# Patient Record
Sex: Male | Born: 1950 | Race: White | Hispanic: No | Marital: Married | Smoking: Former smoker
Health system: Southern US, Community
[De-identification: ages and names within clinical notes are randomized; demographics above are authoritative.]

## PROBLEM LIST (undated history)

## (undated) DIAGNOSIS — E7800 Pure hypercholesterolemia, unspecified: Secondary | ICD-10-CM

## (undated) DIAGNOSIS — K922 Gastrointestinal hemorrhage, unspecified: Secondary | ICD-10-CM

## (undated) DIAGNOSIS — K219 Gastro-esophageal reflux disease without esophagitis: Secondary | ICD-10-CM

## (undated) DIAGNOSIS — C84A0 Cutaneous T-cell lymphoma, unspecified, unspecified site: Secondary | ICD-10-CM

## (undated) DIAGNOSIS — G4733 Obstructive sleep apnea (adult) (pediatric): Secondary | ICD-10-CM

## (undated) DIAGNOSIS — N400 Enlarged prostate without lower urinary tract symptoms: Secondary | ICD-10-CM

## (undated) DIAGNOSIS — Z9989 Dependence on other enabling machines and devices: Secondary | ICD-10-CM

## (undated) DIAGNOSIS — K635 Polyp of colon: Secondary | ICD-10-CM

## (undated) DIAGNOSIS — E0500 Thyrotoxicosis with diffuse goiter without thyrotoxic crisis or storm: Secondary | ICD-10-CM

## (undated) DIAGNOSIS — IMO0002 Reserved for concepts with insufficient information to code with codable children: Secondary | ICD-10-CM

## (undated) HISTORY — PX: HERNIA REPAIR: SHX51

## (undated) HISTORY — DX: Gastrointestinal hemorrhage, unspecified: K92.2

## (undated) HISTORY — DX: Cutaneous T-cell lymphoma, unspecified, unspecified site: C84.A0

## (undated) HISTORY — PX: APPENDECTOMY: SHX54

## (undated) HISTORY — PX: NASAL SEPTUM SURGERY: SHX37

## (undated) HISTORY — PX: COLONOSCOPY W/ POLYPECTOMY: SHX1380

## (undated) HISTORY — DX: Obstructive sleep apnea (adult) (pediatric): G47.33

## (undated) HISTORY — DX: Thyrotoxicosis with diffuse goiter without thyrotoxic crisis or storm: E05.00

## (undated) HISTORY — DX: Polyp of colon: K63.5

## (undated) HISTORY — PX: OTHER SURGICAL HISTORY: SHX169

## (undated) HISTORY — DX: Pure hypercholesterolemia, unspecified: E78.00

## (undated) HISTORY — DX: Benign prostatic hyperplasia without lower urinary tract symptoms: N40.0

## (undated) HISTORY — PX: ELBOW SURGERY: SHX618

## (undated) HISTORY — DX: Reserved for concepts with insufficient information to code with codable children: IMO0002

## (undated) HISTORY — DX: Gastro-esophageal reflux disease without esophagitis: K21.9

## (undated) HISTORY — DX: Dependence on other enabling machines and devices: Z99.89

## (undated) HISTORY — PX: COLON RESECTION: SHX5231

---

## 2012-07-07 ENCOUNTER — Other Ambulatory Visit: Payer: Self-pay | Admitting: Family Medicine

## 2012-07-07 ENCOUNTER — Ambulatory Visit
Admission: RE | Admit: 2012-07-07 | Discharge: 2012-07-07 | Disposition: A | Discharge status: Home / Self Care | Payer: BC Managed Care – PPO | Source: Ambulatory Visit | Attending: Family Medicine | Admitting: Family Medicine

## 2012-07-07 DIAGNOSIS — M545 Low back pain: Secondary | ICD-10-CM

## 2012-07-08 ENCOUNTER — Other Ambulatory Visit: Payer: Self-pay | Admitting: Family Medicine

## 2012-07-14 ENCOUNTER — Ambulatory Visit
Admission: RE | Admit: 2012-07-14 | Discharge: 2012-07-14 | Disposition: A | Discharge status: Home / Self Care | Payer: BC Managed Care – PPO | Source: Ambulatory Visit | Attending: Family Medicine | Admitting: Family Medicine

## 2015-08-07 ENCOUNTER — Other Ambulatory Visit (HOSPITAL_COMMUNITY): Payer: Self-pay | Admitting: Endocrinology

## 2015-08-07 DIAGNOSIS — E0590 Thyrotoxicosis, unspecified without thyrotoxic crisis or storm: Secondary | ICD-10-CM

## 2015-08-22 ENCOUNTER — Ambulatory Visit (HOSPITAL_COMMUNITY): Payer: Self-pay

## 2015-08-22 ENCOUNTER — Encounter (HOSPITAL_COMMUNITY)
Admission: RE | Admit: 2015-08-22 | Discharge: 2015-08-22 | Disposition: A | Discharge status: Home / Self Care | Payer: BLUE CROSS/BLUE SHIELD | Source: Ambulatory Visit | Attending: Endocrinology | Admitting: Endocrinology

## 2015-08-22 ENCOUNTER — Other Ambulatory Visit (HOSPITAL_COMMUNITY): Payer: Self-pay

## 2015-08-22 DIAGNOSIS — E041 Nontoxic single thyroid nodule: Secondary | ICD-10-CM | POA: Insufficient documentation

## 2015-08-22 DIAGNOSIS — E0590 Thyrotoxicosis, unspecified without thyrotoxic crisis or storm: Secondary | ICD-10-CM

## 2015-08-22 MED ORDER — SODIUM IODIDE I 131 CAPSULE: 7.2000 | Freq: Once | INTRAVENOUS | Status: DC | PRN

## 2015-08-23 ENCOUNTER — Other Ambulatory Visit (HOSPITAL_COMMUNITY): Payer: Self-pay

## 2015-08-23 ENCOUNTER — Encounter (HOSPITAL_COMMUNITY)
Admission: RE | Admit: 2015-08-23 | Discharge: 2015-08-23 | Disposition: A | Discharge status: Home / Self Care | Payer: BLUE CROSS/BLUE SHIELD | Source: Ambulatory Visit | Attending: Endocrinology | Admitting: Endocrinology

## 2015-08-23 DIAGNOSIS — E0590 Thyrotoxicosis, unspecified without thyrotoxic crisis or storm: Secondary | ICD-10-CM | POA: Diagnosis not present

## 2015-08-23 DIAGNOSIS — E041 Nontoxic single thyroid nodule: Secondary | ICD-10-CM | POA: Diagnosis not present

## 2015-08-23 MED ORDER — SODIUM PERTECHNETATE TC 99M INJECTION
10.2000 | Freq: Once | INTRAVENOUS | Status: AC | PRN
  Administered 2015-08-23: 10 via INTRAVENOUS

## 2015-08-30 ENCOUNTER — Other Ambulatory Visit (HOSPITAL_COMMUNITY): Payer: Self-pay | Admitting: Endocrinology

## 2015-08-30 DIAGNOSIS — E041 Nontoxic single thyroid nodule: Secondary | ICD-10-CM

## 2015-09-03 ENCOUNTER — Ambulatory Visit (HOSPITAL_COMMUNITY)
Admission: RE | Admit: 2015-09-03 | Discharge: 2015-09-03 | Disposition: A | Discharge status: Home / Self Care | Payer: BLUE CROSS/BLUE SHIELD | Source: Ambulatory Visit | Attending: Endocrinology | Admitting: Endocrinology

## 2015-09-03 DIAGNOSIS — E041 Nontoxic single thyroid nodule: Secondary | ICD-10-CM

## 2015-09-11 ENCOUNTER — Other Ambulatory Visit: Payer: Self-pay | Admitting: Endocrinology

## 2015-09-11 DIAGNOSIS — E042 Nontoxic multinodular goiter: Secondary | ICD-10-CM

## 2015-09-19 ENCOUNTER — Other Ambulatory Visit (HOSPITAL_COMMUNITY)
Admission: RE | Admit: 2015-09-19 | Discharge: 2015-09-19 | Disposition: A | Discharge status: Home / Self Care | Payer: BLUE CROSS/BLUE SHIELD | Source: Ambulatory Visit | Attending: Physician Assistant | Admitting: Physician Assistant

## 2015-09-19 ENCOUNTER — Ambulatory Visit
Admission: RE | Admit: 2015-09-19 | Discharge: 2015-09-19 | Disposition: A | Discharge status: Home / Self Care | Payer: BLUE CROSS/BLUE SHIELD | Source: Ambulatory Visit | Attending: Endocrinology | Admitting: Endocrinology

## 2015-09-19 DIAGNOSIS — E042 Nontoxic multinodular goiter: Secondary | ICD-10-CM

## 2015-09-19 NOTE — Procedures (Signed)
Using direct ultrasound guidance, 4 passes were made using 25 gauge needles into the nodule within the right lobe of the thyroid.   Ultrasound was used to confirm needle placements on all occasions.   Specimens were sent to Pathology for analysis.   Armelia Penton S Milik Gilreath PA-C 09/19/2015 9:44 AM

## 2015-10-04 ENCOUNTER — Other Ambulatory Visit (HOSPITAL_COMMUNITY): Payer: Self-pay | Admitting: Endocrinology

## 2015-10-04 DIAGNOSIS — E0500 Thyrotoxicosis with diffuse goiter without thyrotoxic crisis or storm: Secondary | ICD-10-CM

## 2015-10-12 ENCOUNTER — Encounter (HOSPITAL_COMMUNITY)
Admission: RE | Admit: 2015-10-12 | Discharge: 2015-10-12 | Disposition: A | Discharge status: Home / Self Care | Payer: BLUE CROSS/BLUE SHIELD | Source: Ambulatory Visit | Attending: Endocrinology | Admitting: Endocrinology

## 2015-10-12 DIAGNOSIS — E041 Nontoxic single thyroid nodule: Secondary | ICD-10-CM | POA: Insufficient documentation

## 2015-10-12 DIAGNOSIS — E0590 Thyrotoxicosis, unspecified without thyrotoxic crisis or storm: Secondary | ICD-10-CM | POA: Diagnosis not present

## 2015-10-12 DIAGNOSIS — E0500 Thyrotoxicosis with diffuse goiter without thyrotoxic crisis or storm: Secondary | ICD-10-CM

## 2015-10-12 MED ORDER — SODIUM IODIDE I 131 CAPSULE
20.0000 | Freq: Once | INTRAVENOUS | Status: AC | PRN
  Administered 2015-10-12: 20 via ORAL

## 2015-12-27 ENCOUNTER — Ambulatory Visit (INDEPENDENT_AMBULATORY_CARE_PROVIDER_SITE_OTHER): Payer: BLUE CROSS/BLUE SHIELD | Admitting: Neurology

## 2015-12-27 ENCOUNTER — Encounter: Payer: Self-pay | Admitting: Neurology

## 2015-12-27 VITALS — BP 150/68 | HR 72 | Resp 16 | Ht 68.0 in | Wt 179.0 lb

## 2015-12-27 DIAGNOSIS — G2581 Restless legs syndrome: Secondary | ICD-10-CM | POA: Diagnosis not present

## 2015-12-27 DIAGNOSIS — G4710 Hypersomnia, unspecified: Secondary | ICD-10-CM | POA: Diagnosis not present

## 2015-12-27 DIAGNOSIS — G4761 Periodic limb movement disorder: Secondary | ICD-10-CM | POA: Diagnosis not present

## 2015-12-27 DIAGNOSIS — Z9989 Dependence on other enabling machines and devices: Principal | ICD-10-CM

## 2015-12-27 DIAGNOSIS — R51 Headache: Secondary | ICD-10-CM | POA: Diagnosis not present

## 2015-12-27 DIAGNOSIS — G4733 Obstructive sleep apnea (adult) (pediatric): Secondary | ICD-10-CM

## 2015-12-27 DIAGNOSIS — R519 Headache, unspecified: Secondary | ICD-10-CM

## 2015-12-27 NOTE — Patient Instructions (Addendum)
Based on your symptoms and your exam I believe you are still at risk for obstructive sleep apnea or OSA, and I think we should proceed with a sleep study to determine whether you do or do not have OSA and how severe it is. If you have more than mild OSA, you will benefit from treatment, such as the oral appliance vs. treatment with CPAP. Please remember, the risks and ramifications of moderate to severe obstructive sleep apnea or OSA are: Cardiovascular disease, including congestive heart failure, stroke, difficult to control hypertension, arrhythmias, and even type 2 diabetes has been linked to untreated OSA. Sleep apnea causes disruption of sleep and sleep deprivation in most cases, which, in turn, can cause recurrent headaches, problems with memory, mood, concentration, focus, and vigilance. Most people with untreated sleep apnea report excessive daytime sleepiness, which can affect their ability to drive. Please do not drive if you feel sleepy.   I will likely see you back after your sleep study to go over the test results and where to go from there. We will call you after your sleep study to advise about the results (most likely, you will hear from Beverlee Nims, my nurse) and to set up an appointment at the time, as necessary.    Our sleep lab administrative assistant, Arrie Aran will meet with you or call you to schedule your sleep study. If you don't hear back from her by next week please feel free to call her at [phone]. This is her direct line and please leave a message with your phone number to call back if you get the voicemail box. She will call back as soon as possible.

## 2015-12-27 NOTE — Progress Notes (Signed)
Subjective:    Patient ID: Ryan Gonzales is a 64 y.o. male.  HPI     Ryan Age, MD, PhD [facility] [address], [address] [address] [place], [place] [address]  Dear Ryan Gonzales Scrape,  I saw your patient, Ryan Gonzales, upon your kind request in my neurologic clinic today for initial consultation of his sleep disorder, in particular, concern for underlying obstructive sleep apnea. The patient is unaccompanied today. As you know, Ryan Gonzales is a 65 year old right-handed gentleman with an underlying medical history of hypertension, hyperlipidemia, thyroid d/s, and arthritis, who reports residual daytime somnolence, and a prior diagnosis of OSA. Epworth Sleepiness Scale score is 7 out of 24 today, fatigue score is 44 out of 63.  He was diagnosed with obstructive sleep apnea over 10 years ago and placed on CPAP therapy. Prior test results were reviewed today: He had a sleep study at [facility] clinic in [place]: 02/11/2005. Total AHI was 14 per hour, REM AHI 49 per hour, oxyhemoglobin desaturation nadir was 85% during supine REM sleep, sleep efficiency 85%, arousal index was 40 per hour. He had an increased percentage of stage II sleep at 81.7%, REM sleep was 7.2%, REM latency prolonged at 173.5 minutes, slow-wave sleep at 8%. His weight at the time was 172 pounds. He then had a CPAP titration study at the same clinic on 03/05/2005 and I reviewed the results: Sleep efficiency was 79%, CPAP was started at 5 cm and increased 12 cm. O2 nadir was 92% on 11 cm. He was placed on a CPAP treatment pressure of 12 cm.  He has been on 2 different oral appliances in the interim, first one in [place], second one in [place], which worked well for 2 years, but by 2010, he had to quit using it d/t teeth pain.  He has since the been using a FFM and CPAP, turned pressure down to 10 cm and then up to 11 cm. He had nasal surgery in 2007.  He reports that he is still using CPAP but we were not able to get  a recent compliance download, last available download was from 2012. He would like to have his sleep apnea reevaluated. He would like to be considered for dental appliance rather than CPAP therapy, especially as he did well for several years on an oral appliance. Of note, he has to use a fullface mask because of mouth opening, nasal congestion improved after his nasal surgery, he denies any allergies, has rare morning headaches, noted morning headaches when he stopped using CPAP briefly thinking he could get away without it but put himself back on it. He does not use a humidifier because of inconvenience with the humidifier but does admit to having quite a bit of dryness because of not having used humidity with his CPAP. He has occasional and mild restless leg symptoms, typically few and far between. 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 He quit smoking in 1976, drinks caffeine in the form of coffee, 3 cups per day, alcohol in the form of beer a couple times per week, typically no liquor. He lives with his wife. He has 1 dog. He lost his only child secondary to a rare glycogen storage disease.  His Past Medical History Is Significant For: Past Medical History:  Diagnosis Date  . BPH (benign prostatic hyperplasia)   . Colon polyps   . CTCL (cutaneous T-cell lymphoma) (HCC)   .  Epicondylitis   . GERD (gastroesophageal reflux disease)   . GI bleeding   . Graves disease   . Hypercholesterolemia   . OSA on CPAP     His Past Surgical History Is Significant For: Past Surgical History:  Procedure Laterality Date  . APPENDECTOMY    . COLON RESECTION    . COLONOSCOPY W/ POLYPECTOMY    . colonscopy    . ELBOW SURGERY Bilateral    tendonitis  . HERNIA REPAIR    . NASAL SEPTUM SURGERY      His Family History Is Significant For: Family History  Problem Relation Gonzales of Onset  . Cancer Mother     skin  . Hypercholesterolemia Mother   .  Cancer Father     bladder  . Hypercholesterolemia Father     His Social History Is Significant For: Social History   Social History  . Marital status: Married    Spouse name: Ryan Gonzales  . Number of children: 1  . Years of education: college   Social History Main Topics  . Smoking status: Former Smoker    Years: 3.00    Quit date: 08/27/1973  . Smokeless tobacco: Never Used  . Alcohol use 1.2 - 2.4 oz/week    1 - 2 Glasses of wine, 1 - 2 Cans of beer per week     Comment: 1-2 weekly  . Drug use: No  . Sexual activity: Not Asked   Other Topics Concern  . None   Social History Narrative   Drinks 3 cups of coffee a day     His Allergies Are:  No Known Allergies:   His Current Medications Are:  Outpatient Encounter Prescriptions as of 12/27/2015  Medication Sig  . Bromelains (BROMELAIN PO) Take 1,000 mg by mouth daily.  . cholecalciferol (VITAMIN D) 1000 UNITS tablet Take 1,000 Units by mouth daily.  Marland Kitchen doxazosin (CARDURA) 1 MG tablet Take 1 mg by mouth daily.  Marland Kitchen levothyroxine (SYNTHROID, LEVOTHROID) 125 MCG tablet Take 125 mcg by mouth daily before breakfast.  . liothyronine (CYTOMEL) 25 MCG tablet   . Multiple Vitamin (MULTIVITAMIN) tablet Take 1 tablet by mouth daily.  Marland Kitchen omeprazole (PRILOSEC) 20 MG capsule Take 20 mg by mouth daily.  . simvastatin (ZOCOR) 20 MG tablet Take 20 mg by mouth daily.  . tamsulosin (FLOMAX) 0.4 MG CAPS capsule   . [DISCONTINUED] CHONDROITIN SULFATE PO Take 800 mg by mouth daily.  . [DISCONTINUED] Glucosamine 500 MG CAPS Take by mouth 2 (two) times daily.  . [DISCONTINUED] Saw Palmetto, Serenoa repens, (SAW PALMETTO PO) Take 1,350 mg by mouth daily.   No facility-administered encounter medications on file as of 12/27/2015.   :  Review of Systems:  Out of a complete 14 point review of systems, all are reviewed and negative with the exception of these symptoms as listed below: Review of Systems  Neurological:       Patient had sleep study about  11 years ago. He was placed on CPAP and reports that he is still using it. He would like to repeat sleep study in hope to get an oral appliance. Last reading we were able to down load was from 2012. Last CPAP machine came from [facility].   Snoring, witnessed apnea  Epworth Sleepiness Scale 0= would never doze 1= slight chance of dozing 2= moderate chance of dozing 3= high chance of dozing  Sitting and reading:1 Watching TV:1 Sitting inactive in a public place (ex. Theater or meeting):0 As a passenger  in a car for an hour without a break:2 Lying down to rest in the afternoon:2 Sitting and talking to someone:0 Sitting quietly after lunch (no alcohol):1 In a car, while stopped in traffic:0 Total:7   Objective:  Neurologic Exam  Physical Exam Physical Examination:   Vitals:   12/27/15 0858  BP: (!) 150/68  Pulse: 72  Resp: 16    General Examination: The patient is a very pleasant 65 y.o. male in no acute distress. He appears well-developed and well-nourished and well groomed.   HEENT: Normocephalic, atraumatic, pupils are equal, round and reactive to light and accommodation. Funduscopic exam is normal with sharp disc margins noted. Extraocular tracking is good without limitation to gaze excursion or nystagmus noted. Normal smooth pursuit is noted. Hearing is grossly intact. Tympanic membranes are clear bilaterally. Face is symmetric with normal facial animation and normal facial sensation. Speech is clear with no dysarthria noted. There is no hypophonia. There is no lip, neck/head, jaw or voice tremor. Neck is supple with full range of passive and active motion. There are no carotid bruits on auscultation. Oropharynx exam reveals: mild mouth dryness, good dental hygiene and mild airway crowding, due to redundant soft palate and tonsils in place, about 1-2+ bilaterally. Mallampati is class II. Tongue protrudes centrally and palate elevates symmetrically. Neck size is 16.5 inches. He has a  Mild overbite. Nasal inspection reveals no significant nasal mucosal bogginess or redness and no septal deviation.   Chest: Clear to auscultation without wheezing, rhonchi or crackles noted.  Heart: S1+S2+0, regular and normal without murmurs, rubs or gallops noted.   Abdomen: Soft, non-tender and non-distended with normal bowel sounds appreciated on auscultation.  Extremities: There is no pitting edema in the distal lower extremities bilaterally. Pedal pulses are intact.  Skin: Warm and dry without trophic changes noted. There are no varicose veins on the right, mild varicose veins in the left calf, stable according to patient.  Musculoskeletal: exam reveals no obvious joint deformities, tenderness or joint swelling or erythema.   Neurologically:  Mental status: The patient is awake, alert and oriented in all 4 spheres. His immediate and remote memory, attention, language skills and fund of knowledge are appropriate. There is no evidence of aphasia, agnosia, apraxia or anomia. Speech is clear with normal prosody and enunciation. Thought process is linear. Mood is normal and affect is normal.  Cranial nerves II - XII are as described above under HEENT exam. In addition: shoulder shrug is normal with equal shoulder height noted. Motor exam: Normal bulk, strength and tone is noted. There is no drift, tremor or rebound. Romberg is negative. Reflexes are 3+ throughout. Fine motor skills and coordination: intact with normal finger taps, normal hand movements, normal rapid alternating patting, normal foot taps and normal foot agility.  Cerebellar testing: No dysmetria or intention tremor on finger to nose testing. Heel to shin is unremarkable bilaterally. There is no truncal or gait ataxia.  Sensory exam: intact to light touch, pinprick, vibration, temperature sense in the upper and lower extremities.  Gait, station and balance: He stands easily. No veering to one side is noted. No leaning to one side  is noted. Posture is Gonzales-appropriate and stance is narrow based. Gait shows normal stride length and normal pace. No problems turning are noted. Tandem walk is unremarkable.   Assessment and Plan:  In summary, Ryan Gonzales is a very pleasant 65 y.o.-year old male with an underlying medical history of hypertension, hyperlipidemia, thyroid d/s, and arthritis, who  was previously diagnosed with obstructive sleep apnea about 11 years ago and placed on CPAP therapy, also tried an oral appliance intermittently for a few years, now back on CPAP therapy for the past 6-7 years. He reports  residual daytime tiredness, has mild intermittent restless leg symptoms, rare morning headaches. He would benefit from reevaluation with a sleep study, would like to pursue a dental appliance a possible.  I had a long chat with the patient about my findings and the diagnosis of OSA, its prognosis and treatment options. We talked about medical treatments, surgical interventions and non-pharmacological approaches. I explained in particular the risks and ramifications of untreated moderate to severe OSA, especially with respect to developing cardiovascular disease down the Road, including congestive heart failure, difficult to treat hypertension, cardiac arrhythmias, or stroke. Even type 2 diabetes has, in part, been linked to untreated OSA. Symptoms of untreated OSA include daytime sleepiness, memory problems, mood irritability and mood disorder such as depression and anxiety, lack of energy, as well as recurrent headaches, especially morning headaches. We talked about trying to maintain a healthy lifestyle in general, as well as the importance of weight control. I encouraged the patient to eat healthy, exercise daily and keep well hydrated, to keep a scheduled bedtime and wake time routine, to not skip any meals and eat healthy snacks in between meals. I advised the patient not to drive when feeling sleepy. I recommended the  following at this time: Diagnostic sleep study.   I explained the sleep test procedure to the patient and also outlined possible surgical and non-surgical treatment options of OSA, including the use of a custom-made dental device (which would require a referral to a specialist dentist or oral surgeon), upper airway surgical options, such as pillar implants, radiofrequency surgery, tongue base surgery, and UPPP (which would involve a referral to an ENT surgeon). Rarely, jaw surgery such as mandibular advancement may be considered.  I also explained the CPAP treatment option to the patient, who indicated that he would be willing to use CPAP if the dental appliance does not work out.  I answered all his questions today and the patient was in agreement. I would like to see him back after the sleep study is completed and encouraged him to call with any interim questions, concerns, problems or updates.   Thank you very much for allowing me to participate in the care of this nice patient. If I can be of any further assistance to you please do not hesitate to call me at [phone].  Sincerely,   Ryan Age, MD, PhD

## 2016-01-27 ENCOUNTER — Ambulatory Visit (INDEPENDENT_AMBULATORY_CARE_PROVIDER_SITE_OTHER): Payer: BLUE CROSS/BLUE SHIELD | Admitting: Neurology

## 2016-01-27 DIAGNOSIS — G4761 Periodic limb movement disorder: Secondary | ICD-10-CM

## 2016-01-27 DIAGNOSIS — G4733 Obstructive sleep apnea (adult) (pediatric): Secondary | ICD-10-CM

## 2016-01-27 DIAGNOSIS — G4720 Circadian rhythm sleep disorder, unspecified type: Secondary | ICD-10-CM

## 2016-02-01 ENCOUNTER — Telehealth: Payer: Self-pay | Admitting: Neurology

## 2016-02-01 NOTE — Progress Notes (Signed)
PATIENT'S NAME:  Ryan Gonzales, Ryan Gonzales DOB:      01/28/2014      MR#:    [id number]     DATE OF RECORDING: 01/27/2016 REFERRING M.D.:  Barth Kirks, DMD Study Performed:   Baseline Polysomnogram HISTORY:  65 year old right-handed gentleman with an underlying medical history of hypertension, hyperlipidemia, thyroid d/s, and arthritis, who reports residual daytime somnolence, and a prior diagnosis of OSA.  The patient endorsed the Epworth Sleepiness Scale at 7/24 points and the Fatigue Score at --- points.    The patient's weight 179 pounds with a height of 68 (inches), resulting in a BMI of 27.1 kg/m2.  The patient's neck circumference measured 16 inches.  CURRENT MEDICATIONS: Bromelains, Cholecalciferol, Doxazosin, Levothyroxine, Liothyronin, Multi-Vitamin, Omeprazole, Simvastatin and Tamsulosin   PROCEDURE:  This is a multichannel digital polysomnogram utilizing the Somnostar 11.2 system.  Electrodes and sensors were applied and monitored per AASM Specifications.   EEG, EOG, Chin and Limb EMG, were sampled at 200 Hz.  ECG, Snore and Nasal Pressure, Thermal Airflow, Respiratory Effort, CPAP Flow and Pressure, Oximetry was sampled at 50 Hz. Digital video and audio were recorded.      BASELINE STUDY  Lights Out was at 23:10 and Lights On at 05:13.  Total recording time (TRT) was 363.5, with a total sleep time (TST) of 318.5 minutes.   The patient's sleep latency was 29 minutes.  REM latency was 80.5 minutes, which is normal.  The sleep efficiency was 87.6 %.     SLEEP ARCHITECTURE: Sleep Period Wake was 25 minutes with mild sleep fragmentation noted, Stage N1 3.9%, Stage N2 66.4%, which is increased, Stage N3 1.6% and Stage R (REM sleep) 28.1%.   RESPIRATORY ANALYSIS:  There was a total of 24 respiratory events:  13 obstructive apneas, 0 central apneas and 0 mixed apneas with a total of 13 apneas and an apnea index (AI) of 2.4. There were 11 hypopneas with a hypopnea index of 2.1. The patient also had 0  respiratory event related arousals (RERAs).      The total APNEA/HYPOPNEA INDEX (AHI) was 4.5 and the total RESPIRATORY DISTURBANCE INDEX was 4.5.  5 events occurred in REM sleep and 14 events in NREM. The REM AHI was 3.4, versus a non-REM AHI of 5.. The patient spent 41% of total sleep time in the supine position. The supine AHI was 8.3 versus a non-supine AHI of 1.9.  OXYGEN SATURATION & C02:  The baseline 02 saturation was 98%, with the lowest being 90%. Time spent below 89% saturation equaled 0 minutes.   PERIODIC LIMB MOVEMENTS:   The patient had a total of 152 Periodic Limb Movements.  The Periodic Limb Movement (PLM) index was 28.6 and the PLM Arousal index was 3.6.   IMPRESSION:  1. Periodic Limb Movement Disorder  2. Dysfunctions associated with sleep stages or arousal from sleep  RECOMMENDATIONS:  1. This study did not demonstrate any significant sleep disordered breathing. Mildly elevated supine AHI of 8.3/hour may improve with weight loss and avoidance of the supine sleep position. 2.  Moderate PLMs were noted without significant arousals; clinical correlation is recommended.    3. A follow up appointment will be scheduled in the Sleep Clinic at [facility]. The referring provider will be notified of the results.    I certify that I have reviewed the entire raw data recording prior to the issuance of this report in accordance with the Standards of Accreditation of the [place] [employer] (AASM)  Star Age, MD, PhD Diplomat, American Board of Psychiatry and Neurology  Diplomat, [place] [employer]

## 2016-02-01 NOTE — Telephone Encounter (Signed)
Patient referred by Dr. Bennett Scrape, seen by me on 12/27/15, diagnostic PSG on 01/27/16.   Please call and notify the patient that the recent sleep study did not show any significant obstructive sleep apnea. Please inform patient that I would like to go over the details of the study during a follow up appointment. Arrange a followup appointment. Also, route or fax report to PCP and referring MD, if other than PCP.  Once you have spoken to patient, you can close this encounter.   Thanks,  Star Age, MD, PhD [facility])

## 2016-02-05 ENCOUNTER — Telehealth: Payer: Self-pay

## 2016-02-05 NOTE — Telephone Encounter (Signed)
I spoke to patient and he is aware of results and recommendations. He is was able to make an appt for Monday.

## 2016-02-05 NOTE — Telephone Encounter (Signed)
Sent sleep study to referring doctor

## 2016-02-11 ENCOUNTER — Ambulatory Visit (INDEPENDENT_AMBULATORY_CARE_PROVIDER_SITE_OTHER): Payer: BLUE CROSS/BLUE SHIELD | Admitting: Neurology

## 2016-02-11 ENCOUNTER — Encounter: Payer: Self-pay | Admitting: Neurology

## 2016-02-11 VITALS — BP 120/62 | HR 78 | Resp 16 | Ht 68.0 in | Wt 180.0 lb

## 2016-02-11 DIAGNOSIS — G4761 Periodic limb movement disorder: Secondary | ICD-10-CM

## 2016-02-11 DIAGNOSIS — G2581 Restless legs syndrome: Secondary | ICD-10-CM | POA: Diagnosis not present

## 2016-02-11 DIAGNOSIS — G4733 Obstructive sleep apnea (adult) (pediatric): Secondary | ICD-10-CM | POA: Diagnosis not present

## 2016-02-11 NOTE — Progress Notes (Signed)
Subjective:    Patient ID: Ryan Gonzales is a 65 y.o. male.  HPI     Interim history:   Ryan Gonzales is a 65 year old right-handed gentleman with an underlying medical history of hypertension, hyperlipidemia, thyroid d/s, and arthritis, who presents for follow-up consultation of his sleep disorder, after his recent sleep study. The patient is accompanied by his wife today. I first met him on 12/27/2015 at the request of his dentist, at which time the patient reported snoring and excessive daytime somnolence as well as a prior diagnosis of OSA. I invited him for sleep study. He had a baseline sleep study on 01/27/2016. I went over his test results with him in detail today. Sleep efficiency was 87.6%, REM latency 80.5 minutes and sleep latency was 29 minutes. Wake after sleep onset was 25 minutes with mild sleep fragmentation noted. He had an increased percentage of stage II sleep, and a mildly increased percentage of REM sleep at 28.1%. Total AHI was 4.5 per hour, REM AHI was 3.4 per hour, supine AHI was 8.3 per hour. Average oxygen saturation was 98%, nadir was 90%. He had moderate PLMS with an index of 28.6 per hour, with an associated mild arousal index of 3.6 per hour only.  Today, 02/11/2016: He has intermittent restless leg symptoms. He does move his legs at night while asleep but not every night. His wife gives some tonic water sometimes at night. He reports having been treated for hyperthyroidism in July with radioiodine, was diagnosed in 3/17 and now on synthroid, which was recently increased from 125 mcg to 127 mcg about a month ago. He sees Dr. Wilson Singer for this. He has trouble breathing through his nose. He is considering seeing an allergy specialist and ENT. He has LBP, intermittently, uses Mobic and heat pad. He had seen a neurosurgeon years ago, had an MRI L spine on 07/15/15: IMPRESSION: Posterior disc protrusion on the right at L4-5 with a small adjacent extruded disc fragment compressing  the right L5 nerve root.  Mild spinal stenosis is present and mild retrolisthesis is present at L4-5.  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  There is impingement of the right L5 nerve root.  Previously:   12/27/2015: He reports residual daytime somnolence, and a prior diagnosis of OSA. Epworth Sleepiness Scale score is 7 out of 24 today, fatigue score is 44 out of 63.  He was diagnosed with obstructive sleep apnea over 10 years ago and placed on CPAP therapy. Prior test results were reviewed today: He had a sleep study at [facility] clinic in [place]: 02/11/2005. Total AHI was 14 per hour, REM AHI 49 per hour, oxyhemoglobin desaturation nadir was 85% during supine REM sleep, sleep efficiency 85%, arousal index was 40 per hour. He had an increased percentage of stage II sleep at 81.7%, REM sleep was 7.2%, REM latency prolonged at 173.5 minutes, slow-wave sleep at 8%. His weight at the time was 172 pounds. He then had a CPAP titration study at the same clinic on 03/05/2005 and I reviewed the results: Sleep efficiency was 79%, CPAP was started at 5 cm and increased 12 cm. O2 nadir was 92% on 11 cm. He was placed on a CPAP treatment pressure of 12 cm.  He has been on 2 different oral appliances in the interim, first one in [place], second one in [place], which worked well for 2 years, but by 2010, he had to quit using it d/t teeth pain.  He has since the been using a FFM and CPAP, turned pressure down to 10 cm and then up to 11 cm. He had nasal surgery in 2007.  He reports that he is still using CPAP but we were not able to get a recent compliance download, last available download was from 2012. He would like to have his sleep apnea reevaluated. He would like to be considered for dental appliance rather than CPAP therapy, especially as he did well for several years on an oral appliance. Of note, he has to use a fullface mask because of mouth opening, nasal congestion improved after his  nasal surgery, he denies any allergies, has rare morning headaches, noted morning headaches when he stopped using CPAP briefly thinking he could get away without it but put himself back on it. He does not use a humidifier because of inconvenience with the humidifier but does admit to having quite a bit of dryness because of not having used humidity with his CPAP. He has occasional and mild restless leg symptoms, typically few and far between. 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 He quit smoking in 1976, drinks caffeine in the form of coffee, 3 cups per day, alcohol in the form of beer a couple times per week, typically no liquor. He lives with his wife. He has 1 dog. He lost his only child secondary to a rare glycogen storage disease.   His Past Medical History Is Significant For: Past Medical History:  Diagnosis Date  . BPH (benign prostatic hyperplasia)   . Colon polyps   . CTCL (cutaneous T-cell lymphoma) (HCC)   . Epicondylitis   . GERD (gastroesophageal reflux disease)   . GI bleeding   . Graves disease   . Hypercholesterolemia   . OSA on CPAP     His Past Surgical History Is Significant For: Past Surgical History:  Procedure Laterality Date  . APPENDECTOMY    . COLON RESECTION    . COLONOSCOPY W/ POLYPECTOMY    . colonscopy    . ELBOW SURGERY Bilateral    tendonitis  . HERNIA REPAIR    . NASAL SEPTUM SURGERY      His Family History Is Significant For: Family History  Problem Relation Age of Onset  . Cancer Mother     skin  . Hypercholesterolemia Mother   . Cancer Father     bladder  . Hypercholesterolemia Father     His Social History Is Significant For: Social History   Social History  . Marital status: Married    Spouse name: Nevin Bloodgood  . Number of children: 1  . Years of education: college   Social History Main Topics  . Smoking status: Former Smoker    Years: 3.00    Quit date: 08/27/1973  .  Smokeless tobacco: Never Used  . Alcohol use 1.2 - 2.4 oz/week    1 - 2 Glasses of wine, 1 - 2 Cans of beer per week     Comment: 1-2 weekly  . Drug use: No  . Sexual activity: Not Asked   Other Topics Concern  . None   Social History Narrative   Drinks 3 cups of coffee a day     His Allergies Are:  No Known Allergies:   His Current Medications Are:  Outpatient Encounter Prescriptions as of 02/11/2016  Medication Sig  . Bromelains (BROMELAIN PO) Take 500 mg by mouth 2 (two) times  daily.   . cholecalciferol (VITAMIN D) 1000 UNITS tablet Take 1,000 Units by mouth daily.  Marland Kitchen doxazosin (CARDURA) 1 MG tablet Take 1 mg by mouth daily.  . Multiple Vitamin (MULTIVITAMIN) tablet Take 1 tablet by mouth daily.  Marland Kitchen omeprazole (PRILOSEC) 20 MG capsule Take 20 mg by mouth daily.  . simvastatin (ZOCOR) 20 MG tablet Take 20 mg by mouth at bedtime.   Marland Kitchen SYNTHROID 137 MCG tablet   . tamsulosin (FLOMAX) 0.4 MG CAPS capsule Take 0.4 mg by mouth at bedtime.   Marland Kitchen tiZANidine (ZANAFLEX) 2 MG tablet   . [DISCONTINUED] levothyroxine (SYNTHROID, LEVOTHROID) 125 MCG tablet Take 125 mcg by mouth daily before breakfast.  . [DISCONTINUED] liothyronine (CYTOMEL) 25 MCG tablet    No facility-administered encounter medications on file as of 02/11/2016.   :  Review of Systems:  Out of a complete 14 point review of systems, all are reviewed and negative with the exception of these symptoms as listed below: Review of Systems  HENT:       Patient has appt with ENT to discuss difficulty breathing through nose.   Musculoskeletal: Positive for back pain.  Neurological:       Patient is here to discuss sleep study.       Objective:  Neurologic Exam  Physical Exam Physical Examination:   Vitals:   02/11/16 0932  BP: 120/62  Pulse: 78  Resp: 16   General Examination: The patient is a very pleasant 65 y.o. male in no acute distress. He appears well-developed and well-nourished and well groomed. Good  spirits.  HEENT: Normocephalic, atraumatic, pupils are equal, round and reactive to light and accommodation. Funduscopic exam is normal with sharp disc margins noted. Extraocular tracking is good without limitation to gaze excursion or nystagmus noted. Normal smooth pursuit is noted. Hearing is grossly intact. Face is symmetric with normal facial animation and normal facial sensation. Speech is clear with no dysarthria noted. There is no hypophonia. There is no lip, neck/head, jaw or voice tremor. Neck is supple with full range of passive and active motion. There are no carotid bruits on auscultation. Oropharynx exam reveals: mild mouth dryness, good dental hygiene and mild airway crowding, due to redundant soft palate and tonsils in place, about 1-2+ bilaterally. Mallampati is class II. Tongue protrudes centrally and palate elevates symmetrically. Nasal inspection reveals no significant nasal mucosal bogginess or redness and no septal deviation.   Chest: Clear to auscultation without wheezing, rhonchi or crackles noted.  Heart: S1+S2+0, regular and normal without murmurs, rubs or gallops noted.   Abdomen: Soft, non-tender and non-distended with normal bowel sounds appreciated on auscultation.  Extremities: There is no pitting edema in the distal lower extremities bilaterally. Pedal pulses are intact.  Skin: Warm and dry without trophic changes noted. There are no varicose veins on the right, mild varicose veins in the left calf, stable according to patient. has a small dime size sharp circumscribed red spot on his left shin, very well demarcated, he states that this is what cutaneous T-cell lymphoma can look like. Almost looks like a fungal infection to me. He does have an appointment with his dermatologist.  Musculoskeletal: exam reveals no obvious joint deformities, tenderness or joint swelling or erythema.   Neurologically:  Mental status: The patient is awake, alert and oriented in all 4  spheres. His immediate and remote memory, attention, language skills and fund of knowledge are appropriate. There is no evidence of aphasia, agnosia, apraxia or anomia. Speech is clear  with normal prosody and enunciation. Thought process is linear. Mood is normal and affect is normal.  Cranial nerves II - XII are as described above under HEENT exam. In addition: shoulder shrug is normal with equal shoulder height noted. Motor exam: Normal bulk, strength and tone is noted. There is no drift, tremor or rebound. Romberg is negative. Reflexes are 3+ throughout. Fine motor skills and coordination: intact with normal finger taps, normal hand movements, normal rapid alternating patting, normal foot taps and normal foot agility.  Cerebellar testing: No dysmetria or intention tremor on finger to nose testing. Heel to shin is unremarkable bilaterally. There is no truncal or gait ataxia.  Sensory exam: intact to light touch in the upper and lower extremities.  Gait, station and balance: He stands easily. No veering to one side is noted. No leaning to one side is noted. Posture is age-appropriate and stance is narrow based. Gait shows normal stride length and normal pace. No problems turning are noted. Tandem walk is unremarkable.   Assessment and Plan:  In summary, MANAN OLMO is a very pleasant 65 year old male with an underlying medical history of hypertension, hyperlipidemia, thyroid d/s, and arthritis, who Presents for follow-up consultation after his recent sleep study. His baseline sleep study from 01/27/2016 showed an overall borderline AHI at 4.5 per hour, supine AHI was 8.3 per hour, rendering mild supine obstructive sleep apnea, average oxygen saturation remained above 90% all night, nadir was 90%, average for the night was 98%. He was was previously diagnosed with obstructive sleep apnea about 11 years ago and placed on CPAP therapy, and then tried an oral appliance intermittently for a few years, then  went back on CPAP therapy for the past 6-7 years. At this juncture, his sleep disordered breathing is not bad enough to warrant CPAP therapy. He does recall, working harder back then, was with [employer] for 30 years and had more fatigue when he was first diagnosed with obstructive sleep apnea. He has intermittent restless leg symptoms. I suggested we proceed with lab work including iron studies and B12 level. We will call him with his test results. We talked about potentially utilizing medication for restless legs and PLMS. We will wait it out some as he still is adjusting to his thyroid medication and may need further adjustment of the dose of his Synthroid.  We talked about the sleep test results in detail today.  physical exam is stable. I suggested a four-month checkup, at which time we will talk about restless leg symptoms and PLMS again and consider medication for this. In the interim, we will call him with his blood test results. If he has a ferritin level less than 50, he may benefit from iron supplementation. He does take a multivitamin.   I answered all their questions today and the patient and his wife were in agreement. I spent 25 minutes in total face-to-face time with the patient, more than 50% of which was spent in counseling and coordination of care, reviewing test results, reviewing medication and discussing or reviewing the diagnosis of PLMD, RLS, OSA, its prognosis and treatment options.

## 2016-02-11 NOTE — Patient Instructions (Addendum)
Your recent sleep study did not show any significant obstructive sleep apnea. You have overall borderline findings, mild sleep apnea was evident when you sleep on your back. For this, CPAP therapy is not necessary. I would recommend that you try to sleep on your sides and try to lose some weight.   We will check blood work today and call you with the test results.  We will talk about restless leg symptoms and treatment for leg movements next time again. We may consider medication for this in the future.

## 2016-02-12 ENCOUNTER — Telehealth: Payer: Self-pay | Admitting: *Deleted

## 2016-02-12 LAB — CBC WITH DIFFERENTIAL/PLATELET
BASOS: 0 %
Basophils Absolute: 0 10*3/uL (ref 0.0–0.2)
EOS (ABSOLUTE): 0.2 10*3/uL (ref 0.0–0.4)
EOS: 4 %
HEMATOCRIT: 40.8 % (ref 37.5–51.0)
HEMOGLOBIN: 13.4 g/dL (ref 12.6–17.7)
Immature Grans (Abs): 0 10*3/uL (ref 0.0–0.1)
Immature Granulocytes: 1 %
LYMPHS ABS: 1.2 10*3/uL (ref 0.7–3.1)
Lymphs: 25 %
MCH: 29.6 pg (ref 26.6–33.0)
MCHC: 32.8 g/dL (ref 31.5–35.7)
MCV: 90 fL (ref 79–97)
MONOCYTES: 11 %
MONOS ABS: 0.5 10*3/uL (ref 0.1–0.9)
Neutrophils Absolute: 3 10*3/uL (ref 1.4–7.0)
Neutrophils: 59 %
Platelets: 249 10*3/uL (ref 150–379)
RBC: 4.53 x10E6/uL (ref 4.14–5.80)
RDW: 15.3 % (ref 12.3–15.4)
WBC: 5 10*3/uL (ref 3.4–10.8)

## 2016-02-12 LAB — B12 AND FOLATE PANEL: Vitamin B-12: 774 pg/mL (ref 211–946)

## 2016-02-12 LAB — IRON AND TIBC
Iron Saturation: 29 % (ref 15–55)
Iron: 99 ug/dL (ref 38–169)
TIBC: 343 ug/dL (ref 250–450)
UIBC: 244 ug/dL (ref 111–343)

## 2016-02-12 LAB — FERRITIN: FERRITIN: 84 ng/mL (ref 30–400)

## 2016-02-12 NOTE — Telephone Encounter (Signed)
Called and spoke to pt about lab results per Dr Rexene Alberts note. Pt verbalized understanding.

## 2016-02-12 NOTE — Telephone Encounter (Signed)
-----   Message from Star Age, MD sent at 02/12/2016  3:07 PM EDT ----- Please call patient, labs looked good, we will pick up our discussion about restless leg syndrome and leg twitching at the next visit. Star Age, MD, PhD [facility])

## 2016-02-12 NOTE — Progress Notes (Signed)
Please call patient, labs looked good, we will pick up our discussion about restless leg syndrome and leg twitching at the next visit. Star Age, MD, PhD [facility])

## 2016-02-26 ENCOUNTER — Ambulatory Visit
Admission: RE | Admit: 2016-02-26 | Discharge: 2016-02-26 | Disposition: A | Discharge status: Home / Self Care | Payer: BLUE CROSS/BLUE SHIELD | Source: Ambulatory Visit | Attending: Family Medicine | Admitting: Family Medicine

## 2016-02-26 ENCOUNTER — Other Ambulatory Visit: Payer: Self-pay | Admitting: Family Medicine

## 2016-02-26 DIAGNOSIS — M533 Sacrococcygeal disorders, not elsewhere classified: Secondary | ICD-10-CM

## 2016-03-10 ENCOUNTER — Other Ambulatory Visit: Payer: Self-pay | Admitting: Family Medicine

## 2016-03-10 DIAGNOSIS — M533 Sacrococcygeal disorders, not elsewhere classified: Secondary | ICD-10-CM

## 2016-03-10 DIAGNOSIS — M545 Low back pain: Secondary | ICD-10-CM

## 2016-03-13 ENCOUNTER — Encounter: Payer: Self-pay | Admitting: Allergy & Immunology

## 2016-03-13 ENCOUNTER — Ambulatory Visit (INDEPENDENT_AMBULATORY_CARE_PROVIDER_SITE_OTHER): Payer: BLUE CROSS/BLUE SHIELD | Admitting: Allergy & Immunology

## 2016-03-13 ENCOUNTER — Encounter (INDEPENDENT_AMBULATORY_CARE_PROVIDER_SITE_OTHER): Payer: Self-pay

## 2016-03-13 VITALS — BP 108/72 | HR 80 | Temp 97.7°F | Ht 69.0 in | Wt 182.4 lb

## 2016-03-13 DIAGNOSIS — G4733 Obstructive sleep apnea (adult) (pediatric): Secondary | ICD-10-CM

## 2016-03-13 DIAGNOSIS — K219 Gastro-esophageal reflux disease without esophagitis: Secondary | ICD-10-CM | POA: Diagnosis not present

## 2016-03-13 DIAGNOSIS — R0981 Nasal congestion: Secondary | ICD-10-CM

## 2016-03-13 DIAGNOSIS — J3089 Other allergic rhinitis: Secondary | ICD-10-CM | POA: Insufficient documentation

## 2016-03-13 MED ORDER — MONTELUKAST SODIUM 10 MG PO TABS: 10.0000 mg | ORAL_TABLET | Freq: Every day | ORAL | 5 refills | Status: DC

## 2016-03-13 NOTE — Progress Notes (Signed)
NEW PATIENT  Date of Service/Encounter:  03/13/16   Assessment:   Chronic nonseasonal allergic rhinitis due to pollen - Plan: Allergy Test, Interdermal Allergy Test  OSA (obstructive sleep apnea)  Chronic nasal congestion  Gastroesophageal reflux disease, esophagitis presence not specified    Plan/Recommendations:   1. Chronic rhinitis with nasal congestion - Testing today was positive to [place] grass, Johnson grass, ragweed, weed, trees, molds, cat, dog, and dust mite - Avoidance measures discussed below. - I would add on Singulair 33m nightly, which can help with nasal enlargement and nasal symptoms. - I would add on a nasal steroid to help with inflammation (Flonase two sprays per nostril daily), which can help shrink the turbinates and might help with avoiding surgery.  - Flonase needs to be used daily for the best relief (you can also use a different nose spray if you have that at home) - Use the Afrin first to open up the nose and then spray the two squirts of Flonase into the nasal cavity. - If the medications do not work, we can consider starting allergy shots as a means of "re-training" your immune system to ignore the allergens. - Check with your insurance company to check on the copays associated with allergy shots.  - General information on the mechanisms and treatment expectations of allergy shots discussed. - Otherwise, you might need surgery as a definitive treatment.   2. OSA (obstructive sleep apnea) - Continue with management per Dr. WErik Obey - Could consider adding on the humidifier feature to see if this can help with your nasal symptom  3. Gastroesophageal reflux disease - These symptoms are likely making your symptoms worse. - Continue with omeprazole daily.  4. Return in about 1 month (around 04/12/2016).   Subjective:   KLAMAJ METOYERis a 65y.o. male presenting today for evaluation of  Chief Complaint  Patient presents with  . New  Evaluation    Interested in allergy test. Having issues breathing through his nose.   .Marland Kitchen KGRANVEL PROUDFOOThas a history of the following: Patient Active Problem List   Diagnosis Date Noted  . Chronic nonseasonal allergic rhinitis due to pollen 03/13/2016  . OSA (obstructive sleep apnea) 03/13/2016  . Gastroesophageal reflux disease 03/13/2016    History obtained from: chart review and patient and his wife.  KJefm Pettywas referred by HShirline Frees MD.     KRayfieldis a 65y.o. male presenting for allergy testing. Mr. BFiliphas an extensive history of chronic rhinitis and nasal congestion. This is been going on for years. [place] He has [place] been allergy tested. He does not note any specific trigger and his symptoms are consistent throughout the entire year. He reports that his "head is always clogged up". He also has an extensive history of snoring. He reports that he had a sleep study performed around 10 or 15 years ago and has been on a CPAP machine for more than 10 years. He recently had another sleep study which was normal. Mr. BWiederholtdoes have a remote history of a deviated septum which was surgically corrected more than a decade ago.  He [facility], but was noted that he had enlarged turbinates bilaterally. Review of his nose showed that he felt that reflux and not using the humidifier with the CPAP were contributing to to his throat symptoms. He also recommended using 1 spray  of Afrin at night every other day to see if that opened him up sufficiently to avoid snoring. He also recommended that he be seen for allergy testing.  Mr. Witter has never tried using a nasal steroid for his symptoms. He does occasionally use antihistamines which will provide minimal relief. Overall, he does not like to take medications. Patient denies postnasal drip, but the wife does endorse throat clearing constantly. He does  have a dog at his home, which has never bothered him in the past.  Mr. Cain does have a history of reflux and takes omeprazole every morning. He has been on this for approximately 15 years. He has had endoscopies in the past, with the last one around 2008. He has a remote history of 2 GI bleeds. He also had a colectomy around the age of 31 secondary to an obstructing polyp. At that time, he had symptoms that consisted of rectal pain and bleeding. He was getting colonoscopies every year given his history of the large polyp at such an early age, but these have now been spaced out to every 5 years. None of his biopsies have been malignant.  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 This was treated initially with an alkylating mustard agent, which resulted in an allergic reaction. He was then switched to PUVA therapy, which she remained on for several months with complete resolution of the lymphoma. This presented initially as always thought to be ringworm but was not responsive to antifungal medications.  Mr. Coonradt also has a history of hyperthyroidism which was diagnosed earlier this calendar year. He has undergone radiation therapy for thyroid ablation and is on thyroid supplementation now. He is followed by Dr. Wilson Singer for this.  Otherwise, there is no history of other atopic diseases, including asthma, drug allergies, food allergies, stinging insect allergies, or urticaria. There is no significant infectious history. Vaccinations are up to date.    Past Medical History: Patient Active Problem List   Diagnosis Date Noted  . Chronic nonseasonal allergic rhinitis due to pollen 03/13/2016  . OSA (obstructive sleep apnea) 03/13/2016  . Gastroesophageal reflux disease 03/13/2016    Medication List:    Medication List       Accurate as of 03/13/16 12:04 PM. Always use your most recent med list.          APPLE CIDER VINEGAR PO Take 450 mg by mouth.   aspirin EC 81 MG  tablet Take 81 mg by mouth daily.   BROMELAIN PO Take 500 mg by mouth 2 (two) times daily.   cholecalciferol 1000 units tablet Commonly known as:  VITAMIN D Take 1,000 Units by mouth daily.   docusate sodium 100 MG capsule Commonly known as:  COLACE Take 100 mg by mouth.   doxazosin 1 MG tablet Commonly known as:  CARDURA Take 1 mg by mouth daily.   levothyroxine 137 MCG tablet Commonly known as:  SYNTHROID, LEVOTHROID   meloxicam 15 MG tablet Commonly known as:  MOBIC Take 15 mg by mouth.   methylcellulose oral powder Take 1 packet by mouth daily.   montelukast 10 MG tablet Commonly known as:  SINGULAIR Take 1 tablet (10 mg total) by mouth at bedtime.   multivitamin tablet Take 1 tablet by mouth daily.   omeprazole 20 MG capsule Commonly known as:  PRILOSEC Take by mouth.   simvastatin 20 MG tablet Commonly known as:  ZOCOR Take 20 mg by mouth at bedtime.   tamsulosin 0.4 MG  Caps capsule Commonly known as:  FLOMAX Take by mouth.   tiZANidine 2 MG tablet Commonly known as:  ZANAFLEX   Turmeric 500 MG Tabs Take 500 mg by mouth.   vitamin C 500 MG tablet Commonly known as:  ASCORBIC ACID Take 500 mg by mouth daily.       Birth History: non-contributory.   Developmental History: Ledger has met all milestones on time. He has required no speech therapy, occupational therapy, or physical therapy.   Past Surgical History: Past Surgical History:  Procedure Laterality Date  . APPENDECTOMY    . COLON RESECTION    . COLONOSCOPY W/ POLYPECTOMY    . colonscopy    . ELBOW SURGERY Bilateral    tendonitis  . HERNIA REPAIR    . NASAL SEPTUM SURGERY       Family History: Family History  Problem Relation Age of Onset  . Cancer Mother     skin  . Hypercholesterolemia Mother   . Cancer Father     bladder  . Hypercholesterolemia Father   . Allergic rhinitis Neg Hx   . Angioedema Neg Hx   . Asthma Neg Hx   . Atopy Neg Hx   . Eczema Neg Hx   .  Immunodeficiency Neg Hx   . Urticaria Neg Hx      Social History: Akash lives at home with his wife. He lives in an [place] year old townhome. There is carpeting throughout the home. He has gas heating and central cooling. There is one dog inside the home. There are no roach or rodent issues. There is no smoking exposure currently, although he did smoke 1 pack per day for 5 years from [place] through 1976. He does have dust mite covers on his bed and pillows. 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 He does pursue woodworking as a hobby. He and his wife had a son who died at the age of 31 secondary to complications of a glycogen storage disease (13 years ago).    Review of Systems: a 14-point review of systems is pertinent for what is mentioned in HPI.  Otherwise, all other systems were negative. Constitutional: negative other than that listed in the HPI Eyes: negative other than that listed in the HPI Ears, nose, mouth, throat, and face: negative other than that listed in the HPI Respiratory: negative other than that listed in the HPI Cardiovascular: negative other than that listed in the HPI Gastrointestinal: negative other than that listed in the HPI Genitourinary: negative other than that listed in the HPI Integument: negative other than that listed in the HPI Hematologic: negative other than that listed in the HPI Musculoskeletal: negative other than that listed in the HPI Neurological: negative other than that listed in the HPI Allergy/Immunologic: negative other than that listed in the HPI    Objective:   Blood pressure 108/72, pulse 80, temperature 97.7 F (36.5 C), temperature source  Oral, height 5' 9"  (1.753 m), weight 182 lb 6.4 oz (82.7 kg), SpO2 94 %. Body mass index is 26.94 kg/m.   Physical Exam:  General: Alert, interactive, in no acute distress. Cooperative with the exam. Very friendly. HEENT: TMs pearly gray, turbinates markedly  edematous and pale with clear discharge, post-pharynx erythematous. No polyps. Difficult to see past the inferior turbinates (R>>L). Neck: Supple without thyromegaly. Adenopathy: no enlarged lymph nodes appreciated in the anterior cervical, occipital, axillary, epitrochlear, inguinal, or popliteal regions Lungs: Clear to auscultation without wheezing, rhonchi or rales. No increased work of breathing. CV: Physiologic splitting of S1/S2, no murmurs. Capillary refill <2 seconds.  Abdomen: Nondistended, nontender. No guarding or rebound tenderness. Bowel sounds faint and present in all fields  Skin: Warm and dry, without lesions or rashes. Extremities:  No clubbing, cyanosis or edema. Neuro:   Grossly intact. No deficits noted.   Diagnostic studies:   Allergy Studies:   Indoor/Outdoor Percutaneous Adult Environmental Panel: negative to all 59 indoor and outdoor allergens  Indoor/Outdoor Selected Intradermal Environmental Panel: positive to [place] grass, Johnson grass, ragweed, weed, trees, molds, cat, dog, and dust mite     Salvatore Marvel, MD FAAAAI [facility]

## 2016-03-13 NOTE — Patient Instructions (Addendum)
1. Chronic rhinitis with nasal congestion - Testing today was positive to [place] grass, Johnson grass, ragweed, weed, trees, molds, cat, dog, and dust mite - Avoidance measures discussed below. - I would add on Singulair 10mg  nightly, which can help with nasal enlargement and nasal symptoms. - I would add on a nasal steroid to help with inflammation (Flonase two sprays per nostril daily), which can help shrink the turbinates and might help with avoiding surgery.  - Flonase needs to be used daily for the best relief (you can also use a different nose spray if you have that at home) - Use the Afrin first to open up the nose and then spray the two squirts of Flonase into the nasal cavity. - If the medications do not work, we can consider starting allergy shots as a means of "re-training" your immune system to ignore the allergens. - Check with your insurance company to check on the copays associated with allergy shots.  - Otherwise, you might need surgery as a definitive treatment.   2. OSA (obstructive sleep apnea) - Continue with management per Dr. Erik Obey. - Could consider adding on the humidifier feature to see if this can help with your nasal symptom  3. Gastroesophageal reflux disease - These symptoms are likely making your symptoms worse. - Continue with omeprazole daily.  4. Return in about 1 month (around 04/12/2016).  Please inform us of any Emergency Department visits, hospitalizations, or changes in symptoms. Call us before going to the ED for breathing or allergy symptoms since we might be able to fit you in for a sick visit. Feel free to contact us anytime with any questions, problems, or concerns.  It was a pleasure to meet you and your family today!   Websites that have reliable patient information: 1. American Academy of Asthma, Allergy, and Immunology: www.aaaai.org 2. Food Allergy Research and Education (FARE): foodallergy.org 3. Mothers of Asthmatics:  http://www.asthmacommunitynetwork.org 4. American College of Allergy, Asthma, and Immunology: www.acaai.org  Reducing Pollen Exposure  The American Academy of Allergy, Asthma and Immunology suggests the following steps to reduce your exposure to pollen during allergy seasons.    1. Do not hang sheets or clothing out to dry; pollen may collect on these items. 2. Do not mow lawns or spend time around freshly cut grass; mowing stirs up pollen. 3. Keep windows closed at night.  Keep car windows closed while driving. 4. Minimize morning activities outdoors, a time when pollen counts are usually at their highest. 5. Stay indoors as much as possible when pollen counts or humidity is high and on windy days when pollen tends to remain in the air longer. 6. Use air conditioning when possible.  Many air conditioners have filters that trap the pollen spores. 7. Use a HEPA room air filter to remove pollen form the indoor air you breathe.  Control of House Dust Mite Allergen    House dust mites play a major role in allergic asthma and rhinitis.  They occur in environments with high humidity wherever human skin, the food for dust mites is found. High levels have been detected in dust obtained from mattresses, pillows, carpets, upholstered furniture, bed covers, clothes and soft toys.  The principal allergen of the house dust mite is found in its feces.  A gram of dust may contain 1,000 mites and 250,000 fecal particles.  Mite antigen is easily measured in the air during house cleaning activities.    1. Encase mattresses, including the box spring, and pillow,  in an air tight cover.  Seal the zipper end of the encased mattresses with wide adhesive tape. 2. Wash the bedding in water of 130 degrees Farenheit weekly.  Avoid cotton comforters/quilts and flannel bedding: the most ideal bed covering is the dacron comforter. 3. Remove all upholstered furniture from the bedroom. 4. Remove carpets, carpet padding,  rugs, and non-washable window drapes from the bedroom.  Wash drapes weekly or use plastic window coverings. 5. Remove all non-washable stuffed toys from the bedroom.  Wash stuffed toys weekly. 6. Have the room cleaned frequently with a vacuum cleaner and a damp dust-mop.  The patient should not be in a room which is being cleaned and should wait 1 hour after cleaning before going into the room. 7. Close and seal all heating outlets in the bedroom.  Otherwise, the room will become filled with dust-laden air.  An electric heater can be used to heat the room. 8. Reduce indoor humidity to less than 50%.  Do not use a humidifier.  Control of Dog or Cat Allergen  Avoidance is the best way to manage a dog or cat allergy. If you have a dog or cat and are allergic to dog or cats, consider removing the dog or cat from the home. If you have a dog or cat but don't want to find it a new home, or if your family wants a pet even though someone in the household is allergic, here are some strategies that may help keep symptoms at bay:  1. Keep the pet out of your bedroom and restrict it to only a few rooms. Be advised that keeping the dog or cat in only one room will not limit the allergens to that room. 2. Don't pet, hug or kiss the dog or cat; if you do, wash your hands with soap and water. 3. High-efficiency particulate air (HEPA) cleaners run continuously in a bedroom or living room can reduce allergen levels over time. 4. Regular use of a high-efficiency vacuum cleaner or a central vacuum can reduce allergen levels. 5. Giving your dog or cat a bath at least once a week can reduce airborne allergen.  Control of Mold Allergen  Mold and fungi can grow on a variety of surfaces provided certain temperature and moisture conditions exist.  Outdoor molds grow on plants, decaying vegetation and soil.  The major outdoor mold, Alternaria and Cladosporium, are found in very high numbers during hot and dry conditions.   Generally, a late Summer - Fall peak is seen for common outdoor fungal spores.  Rain will temporarily lower outdoor mold spore count, but counts rise rapidly when the rainy period ends.  The most important indoor molds are Aspergillus and Penicillium.  Dark, humid and poorly ventilated basements are ideal sites for mold growth.  The next most common sites of mold growth are the bathroom and the kitchen.  Outdoor [employer] 1. Use air conditioning and keep windows closed 2. Avoid exposure to decaying vegetation. 3. Avoid leaf raking. 4. Avoid grain handling. 5. Consider wearing a face mask if working in moldy areas.  Indoor Mold Control 1. Maintain humidity below 50%. 2. Clean washable surfaces with 5% bleach solution. 3. Remove sources e.g. contaminated carpets.

## 2016-03-15 ENCOUNTER — Ambulatory Visit
Admission: RE | Admit: 2016-03-15 | Discharge: 2016-03-15 | Disposition: A | Discharge status: Home / Self Care | Payer: BLUE CROSS/BLUE SHIELD | Source: Ambulatory Visit | Attending: Family Medicine | Admitting: Family Medicine

## 2016-03-15 DIAGNOSIS — M545 Low back pain: Secondary | ICD-10-CM

## 2016-03-15 DIAGNOSIS — M533 Sacrococcygeal disorders, not elsewhere classified: Secondary | ICD-10-CM

## 2016-04-08 ENCOUNTER — Encounter (HOSPITAL_BASED_OUTPATIENT_CLINIC_OR_DEPARTMENT_OTHER): Payer: Self-pay | Admitting: *Deleted

## 2016-04-10 ENCOUNTER — Telehealth: Payer: Self-pay | Admitting: Allergy & Immunology

## 2016-04-10 ENCOUNTER — Ambulatory Visit: Payer: Self-pay | Admitting: Otolaryngology

## 2016-04-10 NOTE — Telephone Encounter (Signed)
Pt called and said that he is having sinus surgery on Monday Dec. 11 and has apoointment on Wed Dec 13 with you. Want to know if you want him to come in then or wait. [phone].

## 2016-04-10 NOTE — H&P (Signed)
Otolaryngology Clinic Note  HPI:    Ryan Gonzales is a 65 y.o. male patient of [name], MD for preoperative evaluation.  We are planning SMR inferior turbinates next week.  He still has a rightward septal deviation, but reducing the turbinates should be sufficient to improve his airway bilaterally.  He does use CPAP with a nose plus mouth mask.  He is able to use this postoperatively, he can go home the same day and return the following day for nasal packing removal.  I discussed nasal hygiene measures and postoperative advancement of activity.  I discussed the surgery in detail including risks and complications.  Questions were answered and informed consent was obtained. PMH/Meds/All/SocHx/FamHx/ROS:   PastMedicalHistory      Past Medical History:  Diagnosis Date  . Acid reflux   . Hearing loss   . High cholesterol   . Hyperthyroidism   . Obstructive sleep apnea       PastSurgicalHistory  Past Surgical History:  Procedure Laterality Date  . COLECTOMY    . ELBOW SURGERY    . HERNIA REPAIR    . NASAL SEPTUM SURGERY    . SMALL INTESTINE SURGERY        No family history of bleeding disorders, wound healing problems or difficulty with anesthesia.   SocialHistory  Social History        Social History  . Marital status: Married    Spouse name: N/A  . Number of children: N/A  . Years of education: N/A      Occupational History  . Not on file.   Social History Main Topics  . Smoking status: Former Research scientist (life sciences)  . Smokeless tobacco: Never Used  . Alcohol use Yes  . Drug use: Unknown  . Sexual activity: Not on file       Other Topics Concern  . Not on file      Social History Narrative  . No narrative on file       Current Outpatient Prescriptions:  .  APPLE CIDER VINEGAR ORAL, Take by mouth., Disp: , Rfl:  .  ascorbic acid, vitamin C, (VITAMIN C) 500 MG tablet, Take 500 mg by mouth daily., Disp: , Rfl:  .   aspirin 81 MG chewable tablet *ANTIPLATELET*, Take 81 mg by mouth daily., Disp: , Rfl:  .  bromelains, bulk, Powd, Take by mouth., Disp: , Rfl:  .  cholecalciferol (VITAMIN D3) 1000 UNIT Tab, Take 1,000 Units by mouth., Disp: , Rfl:  .  docusate sodium (COLACE) 100 MG capsule, Take 100 mg by mouth daily., Disp: , Rfl:  .  HYDROcodone-acetaminophen (NORCO) 5-325 mg per tablet, Take 1-2 tablets by mouth every 4 (four) hours as needed., Disp: 20 tablet, Rfl: 0 .  levothyroxine (SYNTHROID) 137 MCG tablet, , Disp: , Rfl:  .  meloxicam (MOBIC) 15 MG tablet, Take 15 mg by mouth daily., Disp: , Rfl:  .  methylcellulose oral powder, Take by mouth daily., Disp: , Rfl:  .  multivitamin (MULTIVITAMIN) per tablet, Take 1 tablet by mouth., Disp: , Rfl:  .  omeprazole (PRILOSEC) 20 MG capsule, Take by mouth., Disp: , Rfl:  .  POLYETHYLENE GLYCOL 3350 (MIRALAX ORAL), Take by mouth., Disp: , Rfl:  .  simvastatin (ZOCOR) 20 MG tablet, Take by mouth., Disp: , Rfl:  .  tamsulosin (FLOMAX) 0.4 mg Cp24 capsule, Take by mouth., Disp: , Rfl:  .  tiZANidine (ZANAFLEX) 2 MG tablet, , Disp: , Rfl:   A complete ROS  was performed with pertinent positives/negatives noted in the HPI. The remainder of the ROS are negative.    Physical Exam:    There were no vitals taken for this visit. He is trim and healthy.  Mental status is appropriate.  He is breathing through his mouth.  Ears are clear.  Anterior nose shows a mild rightward septal deviation at the caudal strut and bulky inferior turbinates on both sides.  Oral cavity and pharynx clear.  Neck unremarkable. Lungs: Clear to auscultation Heart: Regular rate and rhythm without murmurs Abdomen: Soft, active Extremities: Normal configuration Neurologic: Symmetric, grossly intact.      Impression & Plans:   Hypertrophic inferior turbinates bilateral.  Obstructive sleep apnea.  Plan: We will proceed with SMR inferior turbinates next week.  I gave him a  small prescription for hydrocodone 5 mg for pain relief.  I will see him back one day post op for packing removal.   Lilyan Gilford, MD  [id number]

## 2016-04-10 NOTE — Telephone Encounter (Signed)
We can wait another month and see him when he is several weeks postop.  Thanks, Salvatore Marvel, MD [facility]

## 2016-04-14 ENCOUNTER — Ambulatory Visit (HOSPITAL_BASED_OUTPATIENT_CLINIC_OR_DEPARTMENT_OTHER): Payer: BLUE CROSS/BLUE SHIELD | Admitting: Anesthesiology

## 2016-04-14 ENCOUNTER — Ambulatory Visit (HOSPITAL_BASED_OUTPATIENT_CLINIC_OR_DEPARTMENT_OTHER)
Admission: RE | Admit: 2016-04-14 | Discharge: 2016-04-14 | Disposition: A | Discharge status: Home / Self Care | Payer: BLUE CROSS/BLUE SHIELD | Source: Ambulatory Visit | Attending: Otolaryngology | Admitting: Otolaryngology

## 2016-04-14 ENCOUNTER — Encounter (HOSPITAL_BASED_OUTPATIENT_CLINIC_OR_DEPARTMENT_OTHER): Payer: Self-pay

## 2016-04-14 ENCOUNTER — Encounter (HOSPITAL_BASED_OUTPATIENT_CLINIC_OR_DEPARTMENT_OTHER)
Admission: RE | Disposition: A | Discharge status: Home / Self Care | Payer: Self-pay | Source: Ambulatory Visit | Attending: Otolaryngology

## 2016-04-14 DIAGNOSIS — K219 Gastro-esophageal reflux disease without esophagitis: Secondary | ICD-10-CM | POA: Diagnosis not present

## 2016-04-14 DIAGNOSIS — Z87891 Personal history of nicotine dependence: Secondary | ICD-10-CM | POA: Diagnosis not present

## 2016-04-14 DIAGNOSIS — E7800 Pure hypercholesterolemia, unspecified: Secondary | ICD-10-CM | POA: Diagnosis not present

## 2016-04-14 DIAGNOSIS — G4733 Obstructive sleep apnea (adult) (pediatric): Secondary | ICD-10-CM | POA: Diagnosis not present

## 2016-04-14 DIAGNOSIS — J343 Hypertrophy of nasal turbinates: Secondary | ICD-10-CM | POA: Insufficient documentation

## 2016-04-14 DIAGNOSIS — Z7982 Long term (current) use of aspirin: Secondary | ICD-10-CM | POA: Insufficient documentation

## 2016-04-14 DIAGNOSIS — Z79899 Other long term (current) drug therapy: Secondary | ICD-10-CM | POA: Insufficient documentation

## 2016-04-14 DIAGNOSIS — E0590 Thyrotoxicosis, unspecified without thyrotoxic crisis or storm: Secondary | ICD-10-CM | POA: Diagnosis not present

## 2016-04-14 DIAGNOSIS — J342 Deviated nasal septum: Secondary | ICD-10-CM | POA: Insufficient documentation

## 2016-04-14 DIAGNOSIS — Z9989 Dependence on other enabling machines and devices: Secondary | ICD-10-CM | POA: Diagnosis not present

## 2016-04-14 DIAGNOSIS — Z7984 Long term (current) use of oral hypoglycemic drugs: Secondary | ICD-10-CM | POA: Insufficient documentation

## 2016-04-14 HISTORY — PX: TURBINATE REDUCTION: SHX6157

## 2016-04-14 SURGERY — REDUCTION, NASAL TURBINATE
Anesthesia: General | Site: Nose

## 2016-04-14 MED ORDER — MIDAZOLAM HCL 2 MG/2ML IJ SOLN
INTRAMUSCULAR | Status: AC
  Filled 2016-04-14: qty 2

## 2016-04-14 MED ORDER — FENTANYL CITRATE (PF) 100 MCG/2ML IJ SOLN
INTRAMUSCULAR | Status: AC
  Filled 2016-04-14: qty 2

## 2016-04-14 MED ORDER — DEXAMETHASONE SODIUM PHOSPHATE 4 MG/ML IJ SOLN
INTRAMUSCULAR | Status: DC | PRN
  Administered 2016-04-14: 10 mg via INTRAVENOUS

## 2016-04-14 MED ORDER — LIDOCAINE-EPINEPHRINE 1 %-1:100000 IJ SOLN
INTRAMUSCULAR | Status: DC | PRN
  Administered 2016-04-14: 15 mL

## 2016-04-14 MED ORDER — ONDANSETRON HCL 4 MG/2ML IJ SOLN
INTRAMUSCULAR | Status: DC | PRN
  Administered 2016-04-14: 4 mg via INTRAVENOUS

## 2016-04-14 MED ORDER — CEPHALEXIN 500 MG PO CAPS: 500.0000 mg | ORAL_CAPSULE | Freq: Four times a day (QID) | ORAL | Status: DC

## 2016-04-14 MED ORDER — ONDANSETRON HCL 4 MG/2ML IJ SOLN
INTRAMUSCULAR | Status: AC
  Filled 2016-04-14: qty 2

## 2016-04-14 MED ORDER — OXYMETAZOLINE HCL 0.05 % NA SOLN
NASAL | Status: DC | PRN
  Administered 2016-04-14: 1

## 2016-04-14 MED ORDER — ARTIFICIAL TEARS OP OINT
TOPICAL_OINTMENT | OPHTHALMIC | Status: AC
  Filled 2016-04-14: qty 3.5

## 2016-04-14 MED ORDER — ROCURONIUM BROMIDE 10 MG/ML (PF) SYRINGE
PREFILLED_SYRINGE | INTRAVENOUS | Status: AC
  Filled 2016-04-14: qty 10

## 2016-04-14 MED ORDER — OXYMETAZOLINE HCL 0.05 % NA SOLN
2.0000 | NASAL | Status: AC
  Administered 2016-04-14 (×2): 2 via NASAL

## 2016-04-14 MED ORDER — SCOPOLAMINE 1 MG/3DAYS TD PT72: 1.0000 | MEDICATED_PATCH | Freq: Once | TRANSDERMAL | Status: DC | PRN

## 2016-04-14 MED ORDER — PROPOFOL 10 MG/ML IV BOLUS
INTRAVENOUS | Status: DC | PRN
Start: 2016-04-14 — End: 2016-04-14
  Administered 2016-04-14: 200 mg via INTRAVENOUS

## 2016-04-14 MED ORDER — EPHEDRINE 5 MG/ML INJ
INTRAVENOUS | Status: AC
  Filled 2016-04-14: qty 10

## 2016-04-14 MED ORDER — FENTANYL CITRATE (PF) 100 MCG/2ML IJ SOLN
50.0000 ug | INTRAMUSCULAR | Status: DC | PRN
  Administered 2016-04-14: 50 ug via INTRAVENOUS
  Administered 2016-04-14: 100 ug via INTRAVENOUS

## 2016-04-14 MED ORDER — ROCURONIUM BROMIDE 100 MG/10ML IV SOLN
INTRAVENOUS | Status: DC | PRN
  Administered 2016-04-14: 40 mg via INTRAVENOUS

## 2016-04-14 MED ORDER — BACITRACIN-NEOMYCIN-POLYMYXIN OINTMENT TUBE
TOPICAL_OINTMENT | CUTANEOUS | Status: DC | PRN
  Administered 2016-04-14: 1 via TOPICAL

## 2016-04-14 MED ORDER — FENTANYL CITRATE (PF) 100 MCG/2ML IJ SOLN
25.0000 ug | INTRAMUSCULAR | Status: DC | PRN
  Administered 2016-04-14 (×2): 50 ug via INTRAVENOUS

## 2016-04-14 MED ORDER — CEFAZOLIN SODIUM-DEXTROSE 2-4 GM/100ML-% IV SOLN
2.0000 g | INTRAVENOUS | Status: AC
  Administered 2016-04-14: 2 g via INTRAVENOUS

## 2016-04-14 MED ORDER — OXYMETAZOLINE HCL 0.05 % NA SOLN
NASAL | Status: AC
  Filled 2016-04-14: qty 15

## 2016-04-14 MED ORDER — OXYCODONE HCL 5 MG/5ML PO SOLN
5.0000 mg | Freq: Once | ORAL | Status: AC | PRN
  Administered 2016-04-14: 5 mg via ORAL

## 2016-04-14 MED ORDER — OXYCODONE HCL 5 MG PO TABS: 5.0000 mg | ORAL_TABLET | Freq: Once | ORAL | Status: AC | PRN

## 2016-04-14 MED ORDER — SUCCINYLCHOLINE CHLORIDE 200 MG/10ML IV SOSY
PREFILLED_SYRINGE | INTRAVENOUS | Status: AC
  Filled 2016-04-14: qty 10

## 2016-04-14 MED ORDER — LACTATED RINGERS IV SOLN
INTRAVENOUS | Status: DC
  Administered 2016-04-14: 08:00:00 via INTRAVENOUS

## 2016-04-14 MED ORDER — PHENYLEPHRINE 40 MCG/ML (10ML) SYRINGE FOR IV PUSH (FOR BLOOD PRESSURE SUPPORT)
PREFILLED_SYRINGE | INTRAVENOUS | Status: AC
  Filled 2016-04-14: qty 10

## 2016-04-14 MED ORDER — LIDOCAINE 2% (20 MG/ML) 5 ML SYRINGE
INTRAMUSCULAR | Status: AC
  Filled 2016-04-14: qty 5

## 2016-04-14 MED ORDER — CEFAZOLIN SODIUM-DEXTROSE 2-4 GM/100ML-% IV SOLN
INTRAVENOUS | Status: AC
  Filled 2016-04-14: qty 100

## 2016-04-14 MED ORDER — SUGAMMADEX SODIUM 200 MG/2ML IV SOLN
INTRAVENOUS | Status: DC | PRN
  Administered 2016-04-14: 200 mg via INTRAVENOUS

## 2016-04-14 MED ORDER — DEXAMETHASONE SODIUM PHOSPHATE 10 MG/ML IJ SOLN
INTRAMUSCULAR | Status: AC
  Filled 2016-04-14: qty 1

## 2016-04-14 MED ORDER — MIDAZOLAM HCL 2 MG/2ML IJ SOLN
1.0000 mg | INTRAMUSCULAR | Status: DC | PRN
  Administered 2016-04-14: 2 mg via INTRAVENOUS

## 2016-04-14 MED ORDER — DEXTROSE 5 % IV SOLN: 3.0000 g | INTRAVENOUS | Status: DC

## 2016-04-14 MED ORDER — OXYCODONE HCL 5 MG/5ML PO SOLN
ORAL | Status: AC
  Filled 2016-04-14: qty 5

## 2016-04-14 MED ORDER — ONDANSETRON HCL 4 MG/2ML IJ SOLN: 4.0000 mg | Freq: Four times a day (QID) | INTRAMUSCULAR | Status: DC | PRN

## 2016-04-14 SURGICAL SUPPLY — 31 items
AIRWAY NASO PHAR 26FR 6.5 (TUBING)
AIRWAY NASOPHAR 26 6.5 (TUBING) IMPLANT
ATTRACTOMAT 16X20 MAGNETIC DRP (DRAPES) IMPLANT
CANISTER SUCT 1200ML W/VALVE (MISCELLANEOUS) ×3 IMPLANT
COAGULATOR SUCT 8FR VV (MISCELLANEOUS) IMPLANT
DECANTER SPIKE VIAL GLASS SM (MISCELLANEOUS) IMPLANT
DEPRESSOR TONGUE BLADE STERILE (MISCELLANEOUS) ×6 IMPLANT
DRSG NASOPORE 8CM (GAUZE/BANDAGES/DRESSINGS) IMPLANT
DRSG TELFA 3X8 NADH (GAUZE/BANDAGES/DRESSINGS) ×3 IMPLANT
ELECT REM PT RETURN 9FT ADLT (ELECTROSURGICAL) ×3
ELECTRODE REM PT RTRN 9FT ADLT (ELECTROSURGICAL) ×1 IMPLANT
GAUZE PACKING FOLDED 2  STR (GAUZE/BANDAGES/DRESSINGS) ×2
GAUZE PACKING FOLDED 2 STR (GAUZE/BANDAGES/DRESSINGS) ×1 IMPLANT
GLOVE ECLIPSE 8.0 STRL XLNG CF (GLOVE) ×6 IMPLANT
GOWN STRL REUS W/ TWL LRG LVL3 (GOWN DISPOSABLE) ×1 IMPLANT
GOWN STRL REUS W/ TWL XL LVL3 (GOWN DISPOSABLE) ×1 IMPLANT
GOWN STRL REUS W/TWL LRG LVL3 (GOWN DISPOSABLE) ×2
GOWN STRL REUS W/TWL XL LVL3 (GOWN DISPOSABLE) ×2
NEEDLE HYPO 25X1 1.5 SAFETY (NEEDLE) ×3 IMPLANT
NEEDLE SPNL 25GX3.5 QUINCKE BL (NEEDLE) ×3 IMPLANT
NS IRRIG 1000ML POUR BTL (IV SOLUTION) IMPLANT
PACK BASIN DAY SURGERY FS (CUSTOM PROCEDURE TRAY) ×3 IMPLANT
PACK ENT DAY SURGERY (CUSTOM PROCEDURE TRAY) ×3 IMPLANT
PATTIES SURGICAL .5 X3 (DISPOSABLE) ×3 IMPLANT
SLEEVE SCD COMPRESS KNEE MED (MISCELLANEOUS) IMPLANT
SPONGE GAUZE 2X2 8PLY STER LF (GAUZE/BANDAGES/DRESSINGS) ×1
SPONGE GAUZE 2X2 8PLY STRL LF (GAUZE/BANDAGES/DRESSINGS) ×2 IMPLANT
SUT ETHILON 3 0 PS 1 (SUTURE) IMPLANT
TOWEL OR 17X24 6PK STRL BLUE (TOWEL DISPOSABLE) ×3 IMPLANT
TRAY DSU PREP LF (CUSTOM PROCEDURE TRAY) ×3 IMPLANT
YANKAUER SUCT BULB TIP NO VENT (SUCTIONS) ×3 IMPLANT

## 2016-04-14 NOTE — H&P (View-Only) (Signed)
Otolaryngology Clinic Note  HPI:    Ryan Gonzales is a 65 y.o. male patient of [name], MD for preoperative evaluation.  We are planning SMR inferior turbinates next week.  He still has a rightward septal deviation, but reducing the turbinates should be sufficient to improve his airway bilaterally.  He does use CPAP with a nose plus mouth mask.  He is able to use this postoperatively, he can go home the same day and return the following day for nasal packing removal.  I discussed nasal hygiene measures and postoperative advancement of activity.  I discussed the surgery in detail including risks and complications.  Questions were answered and informed consent was obtained. PMH/Meds/All/SocHx/FamHx/ROS:   PastMedicalHistory      Past Medical History:  Diagnosis Date  . Acid reflux   . Hearing loss   . High cholesterol   . Hyperthyroidism   . Obstructive sleep apnea       PastSurgicalHistory  Past Surgical History:  Procedure Laterality Date  . COLECTOMY    . ELBOW SURGERY    . HERNIA REPAIR    . NASAL SEPTUM SURGERY    . SMALL INTESTINE SURGERY        No family history of bleeding disorders, wound healing problems or difficulty with anesthesia.   SocialHistory  Social History        Social History  . Marital status: Married    Spouse name: N/A  . Number of children: N/A  . Years of education: N/A      Occupational History  . Not on file.   Social History Main Topics  . Smoking status: Former Research scientist (life sciences)  . Smokeless tobacco: Never Used  . Alcohol use Yes  . Drug use: Unknown  . Sexual activity: Not on file       Other Topics Concern  . Not on file      Social History Narrative  . No narrative on file       Current Outpatient Prescriptions:  .  APPLE CIDER VINEGAR ORAL, Take by mouth., Disp: , Rfl:  .  ascorbic acid, vitamin C, (VITAMIN C) 500 MG tablet, Take 500 mg by mouth daily., Disp: , Rfl:  .   aspirin 81 MG chewable tablet *ANTIPLATELET*, Take 81 mg by mouth daily., Disp: , Rfl:  .  bromelains, bulk, Powd, Take by mouth., Disp: , Rfl:  .  cholecalciferol (VITAMIN D3) 1000 UNIT Tab, Take 1,000 Units by mouth., Disp: , Rfl:  .  docusate sodium (COLACE) 100 MG capsule, Take 100 mg by mouth daily., Disp: , Rfl:  .  HYDROcodone-acetaminophen (NORCO) 5-325 mg per tablet, Take 1-2 tablets by mouth every 4 (four) hours as needed., Disp: 20 tablet, Rfl: 0 .  levothyroxine (SYNTHROID) 137 MCG tablet, , Disp: , Rfl:  .  meloxicam (MOBIC) 15 MG tablet, Take 15 mg by mouth daily., Disp: , Rfl:  .  methylcellulose oral powder, Take by mouth daily., Disp: , Rfl:  .  multivitamin (MULTIVITAMIN) per tablet, Take 1 tablet by mouth., Disp: , Rfl:  .  omeprazole (PRILOSEC) 20 MG capsule, Take by mouth., Disp: , Rfl:  .  POLYETHYLENE GLYCOL 3350 (MIRALAX ORAL), Take by mouth., Disp: , Rfl:  .  simvastatin (ZOCOR) 20 MG tablet, Take by mouth., Disp: , Rfl:  .  tamsulosin (FLOMAX) 0.4 mg Cp24 capsule, Take by mouth., Disp: , Rfl:  .  tiZANidine (ZANAFLEX) 2 MG tablet, , Disp: , Rfl:   A complete ROS  was performed with pertinent positives/negatives noted in the HPI. The remainder of the ROS are negative.    Physical Exam:    There were no vitals taken for this visit. He is trim and healthy.  Mental status is appropriate.  He is breathing through his mouth.  Ears are clear.  Anterior nose shows a mild rightward septal deviation at the caudal strut and bulky inferior turbinates on both sides.  Oral cavity and pharynx clear.  Neck unremarkable. Lungs: Clear to auscultation Heart: Regular rate and rhythm without murmurs Abdomen: Soft, active Extremities: Normal configuration Neurologic: Symmetric, grossly intact.      Impression & Plans:   Hypertrophic inferior turbinates bilateral.  Obstructive sleep apnea.  Plan: We will proceed with SMR inferior turbinates next week.  I gave him a  small prescription for hydrocodone 5 mg for pain relief.  I will see him back one day post op for packing removal.   Lilyan Gilford, MD  [id number]

## 2016-04-14 NOTE — Interval H&P Note (Signed)
History and Physical Interval Note:  04/14/2016 8:23 AM  Ryan Gonzales  has presented today for surgery, with the diagnosis of NASAL TURBINATE HYPERTROPHY  The various methods of treatment have been discussed with the patient and family. After consideration of risks, benefits and other options for treatment, the patient has consented to  Procedure(s): TURBINATE REDUCTION (N/A) as a surgical intervention .  The patient's history has been re-reviewed, patient re-examined, no change in status, stable for surgery.  I have re-reviewed the patient's chart and labs.  Questions were answered to the patient's satisfaction.     [name]

## 2016-04-14 NOTE — Discharge Instructions (Signed)
Ice pack x 24 hrs Keep head elevated 3-4 nights Drip pad and change as needed Rinse throat with cool dilute salt water after meals and every few hours as desired Recheck appointment tomorrow for packing removal.  Take some pain medication before this visit. Use your CPAP as best you are able. After the packing is out, use the nasal hygiene measures I gave you in the office.  Hydrocodone and/or Ibuprofen for pain relief  Post Anesthesia Home Care Instructions  Activity: Get plenty of rest for the remainder of the day. A responsible adult should stay with you for 24 hours following the procedure.  For the next 24 hours, DO NOT: -Drive a car -Paediatric nurse -Drink alcoholic beverages -Take any medication unless instructed by your physician -Make any legal decisions or sign important papers.  Meals: Start with liquid foods such as gelatin or soup. Progress to regular foods as tolerated. Avoid greasy, spicy, heavy foods. If nausea and/or vomiting occur, drink only clear liquids until the nausea and/or vomiting subsides. Call your physician if vomiting continues.  Special Instructions/Symptoms: Your throat may feel dry or sore from the anesthesia or the breathing tube placed in your throat during surgery. If this causes discomfort, gargle with warm salt water. The discomfort should disappear within 24 hours.  If you had a scopolamine patch placed behind your ear for the management of post- operative nausea and/or vomiting:  1. The medication in the patch is effective for 72 hours, after which it should be removed.  Wrap patch in a tissue and discard in the trash. Wash hands thoroughly with soap and water. 2. You may remove the patch earlier than 72 hours if you experience unpleasant side effects which may include dry mouth, dizziness or visual disturbances. 3. Avoid touching the patch. Wash your hands with soap and water after contact with the patch.

## 2016-04-14 NOTE — Op Note (Signed)
04/14/2016  9:39 AM    Ryan Gonzales  [id number]   Pre-Op Dx:   Hypertrophic Inferior Turbinates  Post-op Dx: Same  Proc:  Bilateral SMR Inferior Turbinates   Surg:  [name] T MD  Anes:  GOT  EBL:  min  Comp:   none  Findings:  Slight anterior RIGHTward septal deviation, mild LEFT maxillary crest spurring.  Bulky inferior turbinates, RIGHT>LEFT.  Procedure: With the patient in a comfortable supine position,  general orotracheal anesthesia was induced without difficulty.     The patient received preoperative Afrin spray for topical decongestion and vasoconstriction.  Intravenous prophylactic antibiotics were administered.  At an appropriate level, the patient was placed in a semi-sitting position.  A saline moistened throat pack was placed.  Nasal vibrissae were trimmed.   Afrin  solution was applied on 0.5" x 3" cottonoids to both sides of the septal mucosa.  1/4% marcaine with 1:200,000 epinephrine, 15 cc's, was infiltrated into the inferior turbinates bilaterally and  into the submucoperichondrial plane of the septum on both sides.  Several minutes were allowed for this to take effect.  A sterile preparation and draping of the midface was accomplished in the standard fashion.  The materials were removed from the nose and observed to be intact and correct in number.  The nose was inspected with a headlight with the findings as described above.  No septoplasty was deemed necessary.   Beginning on the RIGHT side, the inferior turbinate was inspected and infractured.  The anterior hood of the inferior turbinate was sharply lysed just behind the nasal valve.  The medial mucosa of the inferior turbinate was incised in an  anterior upsloping fashion and a laterally based flap was developed from the turbinate bone.  Using angled turbinate scissors, turbinate bone and lateral mucosa were resected in a posterior downsloping fashion, taking much of the anterior pole and leaving  most of the posterior pole.  Bony spicules were submucosally dissected and removed.  The mucosal flap was laid back down and the turbinate was outfractured.  This completed one SMR inferior turbinate.  The opposite side was performed in identical fashion.  The cut mucosal edges were suction coagulated on both sides for hemostasis.  Hemostasis was observed.     Telfa packs impregnated with bacitracin ointment were placed between the septum and the inferior turbinates, one on each side, for hemostasis and support.  A 7.0 mm nasal trumpet was shortened and placed in each side of the nose to allow some airway.     At this point the procedure was completed.  The pharynx was suctioned free and the throat pack was removed.   The patient was returned to anesthesia, awakened, extubated, and transferred to recovery in stable condition.  Dispo:   PACU to home.  Overnight stay if he is unable to use CPAP or O2 sats are not satisfactory.    Plan: Ice, elevation, narcotic analgesia, prophylactic antibiotics for the duration of indwelling nasal foreign bodies.  We will remove the nasal packing In one day, the septal splints in 10 days.  Return to work in 10 days, strenuous activities in two weeks.  Tyson Alias MD

## 2016-04-14 NOTE — Anesthesia Preprocedure Evaluation (Signed)
Anesthesia Evaluation  Patient identified by MRN, date of birth, ID band Patient awake    Reviewed: Allergy & Precautions, H&P , NPO status , Patient's Chart, lab work & pertinent test results  Airway Mallampati: II   Neck ROM: full    Dental   Pulmonary sleep apnea , former smoker,    breath sounds clear to auscultation       Cardiovascular negative cardio ROS   Rhythm:regular Rate:Normal     Neuro/Psych    GI/Hepatic GERD  ,  Endo/Other  Hyperthyroidism   Renal/GU      Musculoskeletal  (+) Arthritis ,   Abdominal   Peds  Hematology lymphoma   Anesthesia Other Findings   Reproductive/Obstetrics                             Anesthesia Physical Anesthesia Plan  ASA: III  Anesthesia Plan: General   Post-op Pain Management:    Induction: Intravenous  Airway Management Planned: Oral ETT  Additional Equipment:   Intra-op Plan:   Post-operative Plan: Extubation in OR  Informed Consent: I have reviewed the patients History and Physical, chart, labs and discussed the procedure including the risks, benefits and alternatives for the proposed anesthesia with the patient or authorized representative who has indicated his/her understanding and acceptance.     Plan Discussed with: CRNA, Anesthesiologist and Surgeon  Anesthesia Plan Comments:         Anesthesia Quick Evaluation

## 2016-04-14 NOTE — Anesthesia Postprocedure Evaluation (Signed)
Anesthesia Post Note  Patient: Ryan Gonzales  Procedure(s) Performed: Procedure(s) (LRB): TURBINATE REDUCTION (N/A)  Patient location during evaluation: PACU Anesthesia Type: General Level of consciousness: awake and alert and patient cooperative Pain management: pain level controlled Vital Signs Assessment: post-procedure vital signs reviewed and stable Respiratory status: spontaneous breathing and respiratory function stable Cardiovascular status: stable Anesthetic complications: no    Last Vitals:  Vitals:   04/14/16 1005 04/14/16 1015  BP:  128/81  Pulse: 66 70  Resp: (!) 9 11  Temp:      Last Pain:  Vitals:   04/14/16 0952  TempSrc:   PainSc: [place]

## 2016-04-14 NOTE — Transfer of Care (Signed)
Immediate Anesthesia Transfer of Care Note  Patient: Ryan Gonzales  Procedure(s) Performed: Procedure(s): TURBINATE REDUCTION (N/A)  Patient Location: PACU  Anesthesia Type:General  Level of Consciousness: awake, alert  and oriented  Airway & Oxygen Therapy: Patient Spontanous Breathing and Patient connected to nasal cannula oxygen  Post-op Assessment: Report given to RN and Post -op Vital signs reviewed and stable  Post vital signs: Reviewed and stable  Last Vitals:  Vitals:   04/14/16 0736  BP: 112/72  Pulse: 74  Resp: 18  Temp: 36.6 C    Last Pain:  Vitals:   04/14/16 0736  TempSrc: Oral         Complications: No apparent anesthesia complications

## 2016-04-14 NOTE — Anesthesia Procedure Notes (Addendum)
Procedure Name: Intubation Date/Time: 04/14/2016 8:51 AM Performed by: Melynda Ripple D Pre-anesthesia Checklist: Patient identified, Emergency Drugs available, Suction available and Patient being monitored Patient Re-evaluated:Patient Re-evaluated prior to inductionOxygen Delivery Method: Circle system utilized Preoxygenation: Pre-oxygenation with 100% oxygen Intubation Type: IV induction Ventilation: Mask ventilation without difficulty Laryngoscope Size: Mac and 3 Grade View: Grade II Tube type: Oral Tube size: 7.0 mm Number of attempts: 2 Airway Equipment and Method: Stylet and Oral airway Placement Confirmation: ETT inserted through vocal cords under direct vision,  positive ETCO2 and breath sounds checked- equal and bilateral Secured at: 24 cm Tube secured with: Tape Dental Injury: Teeth and Oropharynx as per pre-operative assessment and Injury to lip

## 2016-04-15 ENCOUNTER — Encounter (HOSPITAL_BASED_OUTPATIENT_CLINIC_OR_DEPARTMENT_OTHER): Payer: Self-pay | Admitting: Otolaryngology

## 2016-04-16 ENCOUNTER — Ambulatory Visit: Payer: BLUE CROSS/BLUE SHIELD | Admitting: Allergy & Immunology

## 2016-06-16 ENCOUNTER — Ambulatory Visit: Payer: BLUE CROSS/BLUE SHIELD | Admitting: Neurology

## 2016-07-06 ENCOUNTER — Other Ambulatory Visit: Payer: Self-pay | Admitting: Allergy & Immunology

## 2016-10-04 ENCOUNTER — Other Ambulatory Visit: Payer: Self-pay | Admitting: Allergy & Immunology

## 2016-10-06 ENCOUNTER — Other Ambulatory Visit: Payer: Self-pay | Admitting: Allergy & Immunology

## 2016-10-06 NOTE — Telephone Encounter (Signed)
I denied refill for montelukast. Patient was last seen 03/13/2016. They were given a refill on 07/16/16. Patient needs office visit for further refills.

## 2017-01-27 ENCOUNTER — Other Ambulatory Visit: Payer: Self-pay | Admitting: Allergy & Immunology

## 2017-01-28 IMAGING — US US THYROID
1 series · 14 of 25 positions shown · non-contrast
Comparison: None.

CLINICAL DATA: Hyperthyroidism right lower pole cold nodule on
nuclear medicine imaging.

EXAM:
THYROID ULTRASOUND
TECHNIQUE: Ultrasound examination of the thyroid gland and adjacent soft
tissues was performed.

[Series 1: us thyroid · 0.08mm/px · 14 of 74 slices shown]
[im 1/74]
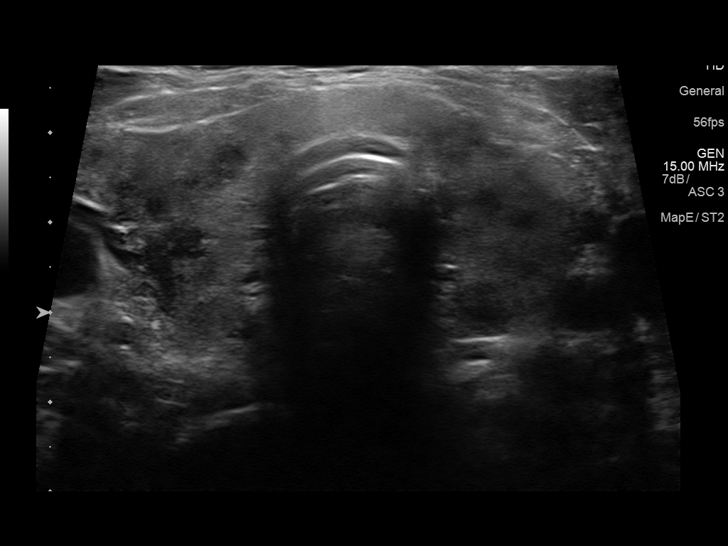
[im 7/74]
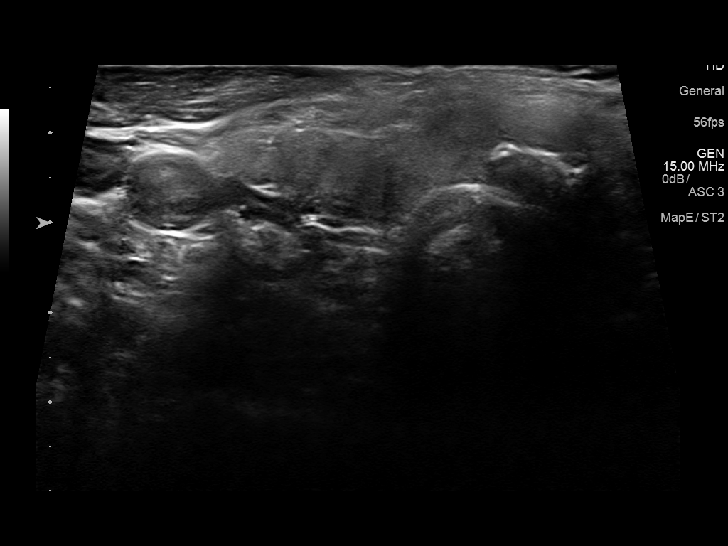
[im 13/74]
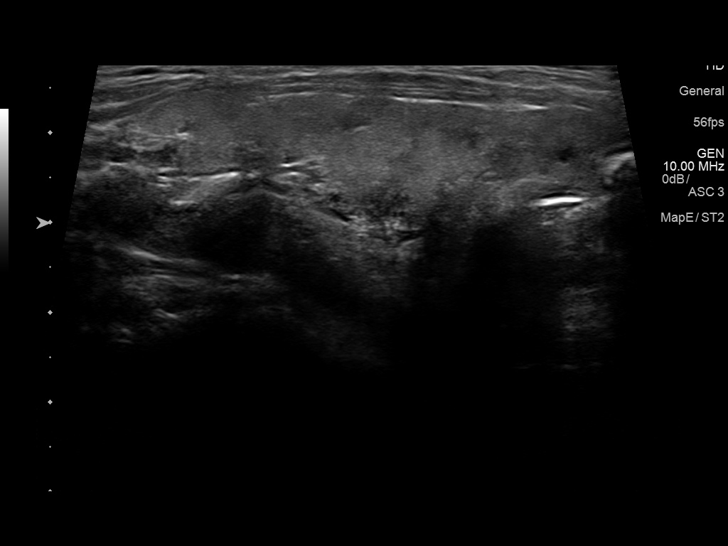
[im 19/74]
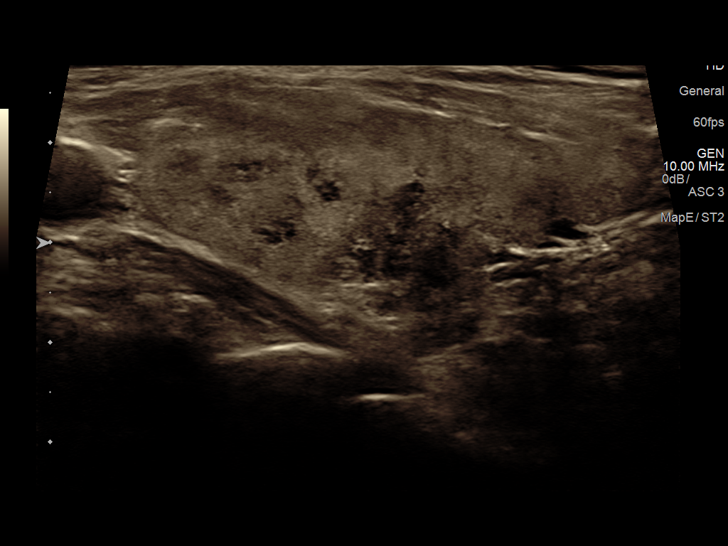
[im 25/74]
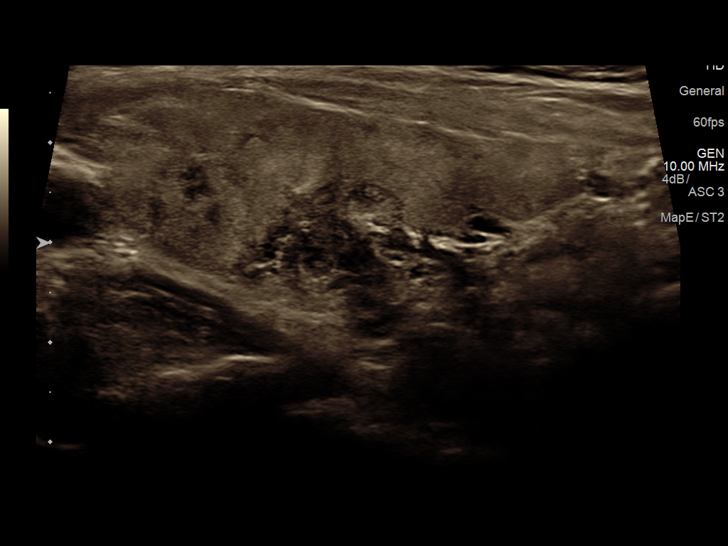
[im 28/74]
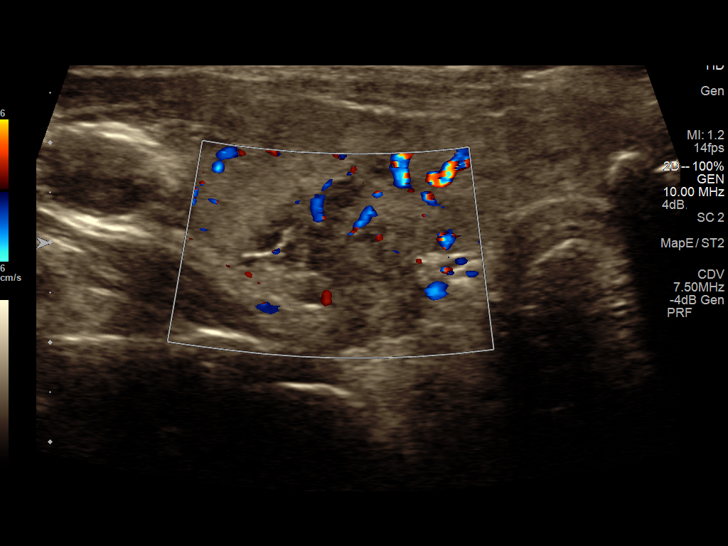
[im 34/74]
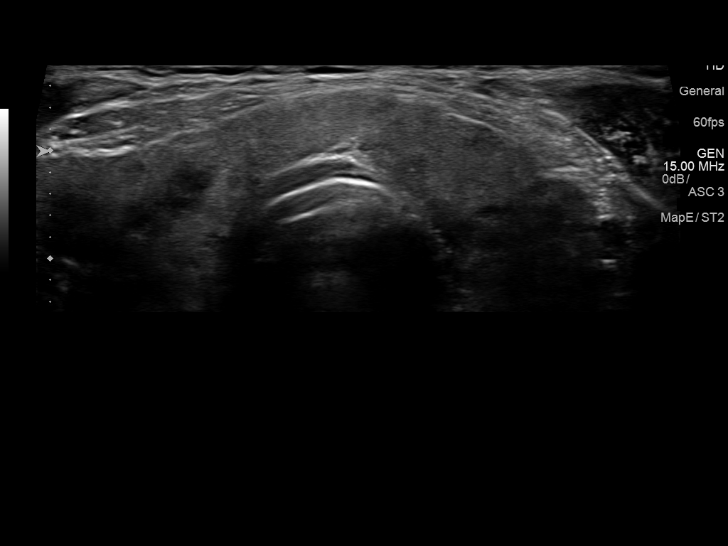
[im 40/74]
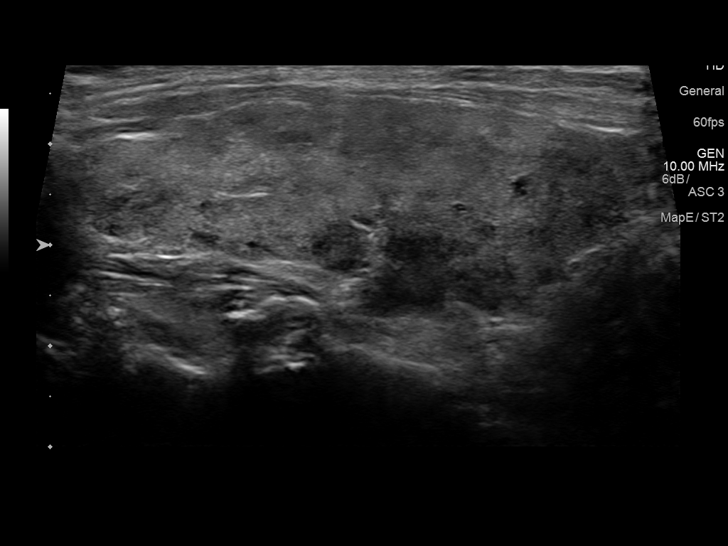
[im 46/74]
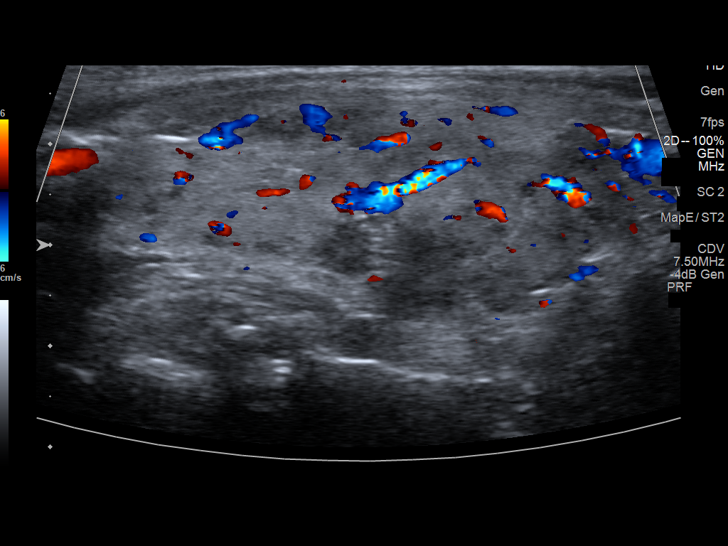
[im 49/74]
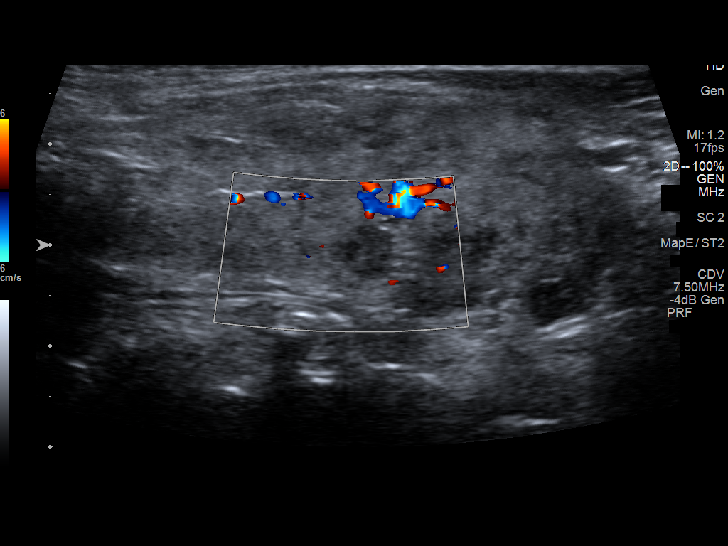
[im 55/74]
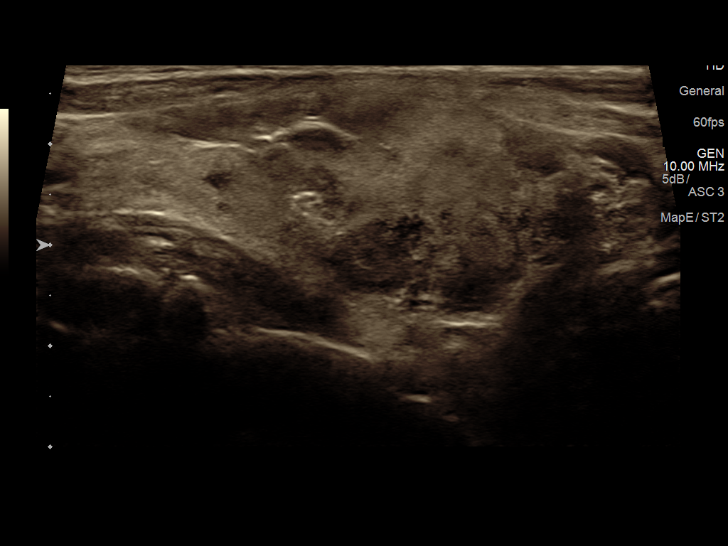
[im 61/74]
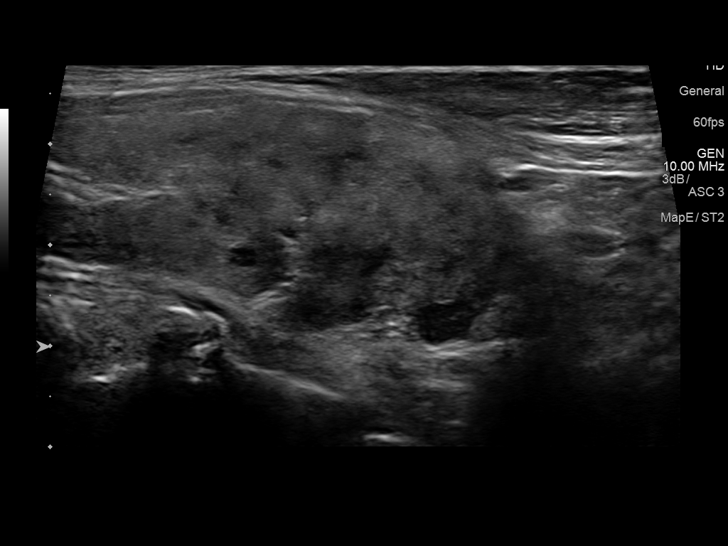
[im 67/74]
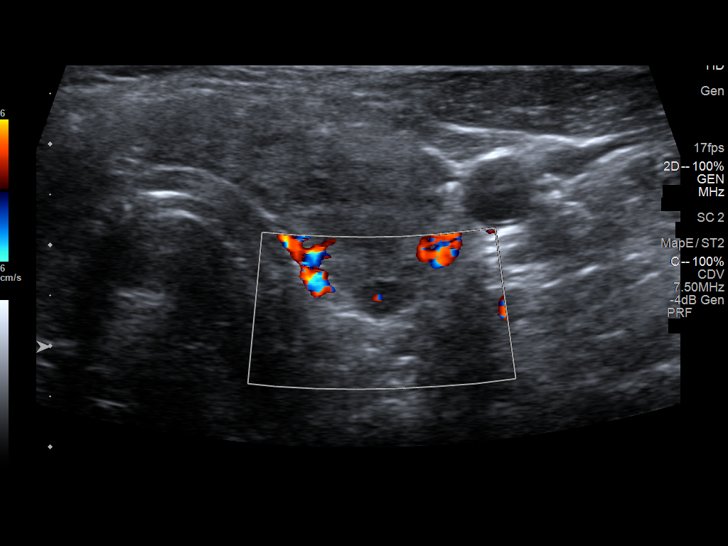
[im 74/74]
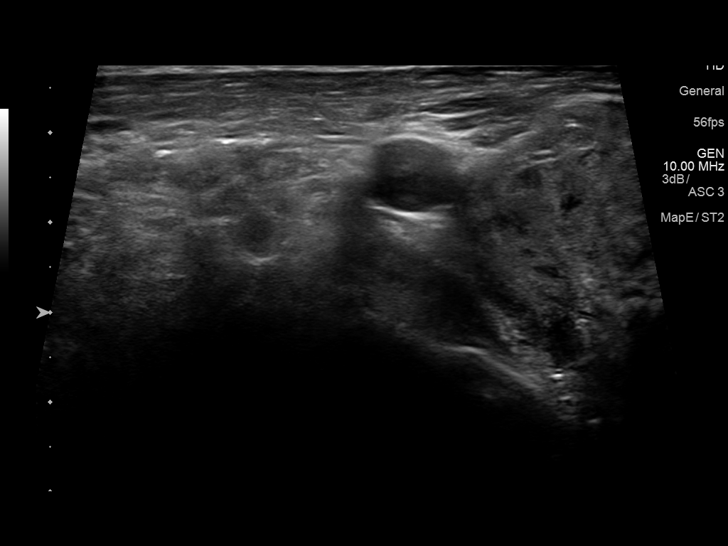

[14 of 25 positions shown; findings below may reference images not displayed]

FINDINGS: Right thyroid lobe

Measurements: 5.9 x 2.5 x 2.6 cm. Heterogeneous tissue.
Heterogeneous lower pole hypoechoic posterior nodule measures 1.5 x
1.3 x 1.7 cm.

Left thyroid lobe

Measurements: 6.6 x 2.6 x 2.3 cm. Several lower pole nodules are
noted. The largest measures 1.2 x 0.7 x 0.7 cm. There is an adjacent
nodule measuring 1.1 x 0.8 x 0.9 cm. 7 mm adjacent solid nodule.

Isthmus

Thickness: 0.7 cm.  No nodules visualized.

Lymphadenopathy

None visualized.
IMPRESSION: Bilateral nodules as described. The dominant nodule corresponds to
the cold defect on nuclear medicine imaging in the right lower pole.
It measures 1.7 cm. Findings meet consensus criteria for biopsy.
Ultrasound-guided fine needle aspiration should be considered, as
per the consensus statement: Management of Thyroid Nodules Detected
at [place]: Society of Radiologists in Ultrasound Consensus Conference

## 2018-11-20 ENCOUNTER — Other Ambulatory Visit: Payer: Self-pay | Admitting: Family Medicine

## 2018-11-20 DIAGNOSIS — M810 Age-related osteoporosis without current pathological fracture: Secondary | ICD-10-CM

## 2018-12-02 ENCOUNTER — Ambulatory Visit
Admission: RE | Admit: 2018-12-02 | Discharge: 2018-12-02 | Disposition: A | Discharge status: Home / Self Care | Payer: Medicare Other | Source: Ambulatory Visit | Attending: Family Medicine | Admitting: Family Medicine

## 2018-12-02 ENCOUNTER — Other Ambulatory Visit: Payer: Self-pay

## 2018-12-02 DIAGNOSIS — M810 Age-related osteoporosis without current pathological fracture: Secondary | ICD-10-CM

## 2020-01-04 ENCOUNTER — Other Ambulatory Visit: Payer: Self-pay | Admitting: Gastroenterology

## 2020-01-04 DIAGNOSIS — K921 Melena: Secondary | ICD-10-CM

## 2020-01-04 DIAGNOSIS — D649 Anemia, unspecified: Secondary | ICD-10-CM

## 2020-01-17 ENCOUNTER — Other Ambulatory Visit: Payer: Self-pay

## 2020-01-17 ENCOUNTER — Ambulatory Visit
Admission: RE | Admit: 2020-01-17 | Discharge: 2020-01-17 | Disposition: A | Discharge status: Home / Self Care | Payer: Medicare Other | Source: Ambulatory Visit | Attending: Gastroenterology | Admitting: Gastroenterology

## 2020-01-17 DIAGNOSIS — K921 Melena: Secondary | ICD-10-CM

## 2020-01-17 DIAGNOSIS — D649 Anemia, unspecified: Secondary | ICD-10-CM

## 2020-01-17 MED ORDER — IOPAMIDOL (ISOVUE-300) INJECTION 61%
100.0000 mL | Freq: Once | INTRAVENOUS | Status: AC | PRN
  Administered 2020-01-17: 100 mL via INTRAVENOUS

## 2021-12-11 ENCOUNTER — Other Ambulatory Visit: Payer: Self-pay | Admitting: Family Medicine

## 2021-12-11 ENCOUNTER — Ambulatory Visit
Admission: RE | Admit: 2021-12-11 | Discharge: 2021-12-11 | Disposition: A | Discharge status: Home / Self Care | Payer: Medicare Other | Source: Ambulatory Visit | Attending: Family Medicine | Admitting: Family Medicine

## 2021-12-11 DIAGNOSIS — R059 Cough, unspecified: Secondary | ICD-10-CM

## 2022-08-20 ENCOUNTER — Other Ambulatory Visit: Payer: Self-pay | Admitting: Family Medicine

## 2022-08-20 DIAGNOSIS — M818 Other osteoporosis without current pathological fracture: Secondary | ICD-10-CM

## 2023-03-13 ENCOUNTER — Ambulatory Visit
Admission: RE | Admit: 2023-03-13 | Discharge: 2023-03-13 | Disposition: A | Discharge status: Home / Self Care | Payer: Medicare Other | Source: Ambulatory Visit | Attending: Family Medicine | Admitting: Family Medicine

## 2023-03-13 DIAGNOSIS — M818 Other osteoporosis without current pathological fracture: Secondary | ICD-10-CM

## 2023-10-30 ENCOUNTER — Other Ambulatory Visit: Payer: Self-pay

## 2023-11-05 LAB — SURGICAL PATHOLOGY

## 2023-12-25 ENCOUNTER — Other Ambulatory Visit (HOSPITAL_BASED_OUTPATIENT_CLINIC_OR_DEPARTMENT_OTHER): Payer: Self-pay | Admitting: Family Medicine

## 2023-12-25 DIAGNOSIS — Z136 Encounter for screening for cardiovascular disorders: Secondary | ICD-10-CM

## 2024-01-18 ENCOUNTER — Ambulatory Visit (HOSPITAL_BASED_OUTPATIENT_CLINIC_OR_DEPARTMENT_OTHER)
Admission: RE | Admit: 2024-01-18 | Discharge: 2024-01-18 | Disposition: A | Discharge status: Home / Self Care | Payer: Self-pay | Source: Ambulatory Visit | Attending: Family Medicine | Admitting: Family Medicine

## 2024-01-18 DIAGNOSIS — Z136 Encounter for screening for cardiovascular disorders: Secondary | ICD-10-CM | POA: Insufficient documentation
